# Patient Record
Sex: Male | Born: 1990 | Race: Black or African American | Hispanic: No | Marital: Single | Smoking: Never smoker
Health system: Southern US, Community
[De-identification: ages and names within clinical notes are randomized; demographics above are authoritative.]

## PROBLEM LIST (undated history)

## (undated) DIAGNOSIS — J45909 Unspecified asthma, uncomplicated: Secondary | ICD-10-CM

---
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
## 2008-05-18 ENCOUNTER — Emergency Department (HOSPITAL_COMMUNITY): Admission: EM | Admit: 2008-05-18 | Discharge: 2008-05-18 | Payer: Self-pay | Admitting: Emergency Medicine

## 2008-09-05 ENCOUNTER — Emergency Department (HOSPITAL_COMMUNITY): Admission: EM | Admit: 2008-09-05 | Discharge: 2008-09-05 | Payer: Self-pay | Admitting: Emergency Medicine

## 2008-09-06 ENCOUNTER — Emergency Department (HOSPITAL_COMMUNITY): Admission: EM | Admit: 2008-09-06 | Discharge: 2008-09-07 | Payer: Self-pay | Admitting: Emergency Medicine

## 2010-03-25 ENCOUNTER — Emergency Department (HOSPITAL_COMMUNITY): Admission: EM | Admit: 2010-03-25 | Discharge: 2010-03-26 | Payer: Self-pay | Admitting: Emergency Medicine

## 2010-11-29 ENCOUNTER — Emergency Department (HOSPITAL_COMMUNITY)
Admission: EM | Admit: 2010-11-29 | Discharge: 2010-11-29 | Payer: Self-pay | Source: Home / Self Care | Admitting: Emergency Medicine

## 2010-12-23 ENCOUNTER — Emergency Department (HOSPITAL_COMMUNITY)
Admission: EM | Admit: 2010-12-23 | Discharge: 2010-12-23 | Payer: Self-pay | Source: Home / Self Care | Admitting: Emergency Medicine

## 2011-01-14 ENCOUNTER — Encounter: Payer: Self-pay | Admitting: Pediatrics

## 2011-03-04 ENCOUNTER — Emergency Department (HOSPITAL_COMMUNITY)
Admission: EM | Admit: 2011-03-04 | Discharge: 2011-03-05 | Disposition: A | Discharge status: Home / Self Care | Payer: Self-pay | Attending: Emergency Medicine | Admitting: Emergency Medicine

## 2011-03-04 DIAGNOSIS — B309 Viral conjunctivitis, unspecified: Secondary | ICD-10-CM | POA: Insufficient documentation

## 2011-03-04 DIAGNOSIS — H5789 Other specified disorders of eye and adnexa: Secondary | ICD-10-CM | POA: Insufficient documentation

## 2011-03-04 DIAGNOSIS — H5710 Ocular pain, unspecified eye: Secondary | ICD-10-CM | POA: Insufficient documentation

## 2011-03-06 ENCOUNTER — Emergency Department (HOSPITAL_COMMUNITY): Payer: Self-pay

## 2011-03-06 ENCOUNTER — Emergency Department (HOSPITAL_COMMUNITY)
Admission: EM | Admit: 2011-03-06 | Discharge: 2011-03-06 | Disposition: A | Discharge status: Home / Self Care | Payer: Self-pay | Attending: Emergency Medicine | Admitting: Emergency Medicine

## 2011-03-06 DIAGNOSIS — Z0000 Encounter for general adult medical examination without abnormal findings: Secondary | ICD-10-CM | POA: Insufficient documentation

## 2011-03-06 DIAGNOSIS — H538 Other visual disturbances: Secondary | ICD-10-CM | POA: Insufficient documentation

## 2011-03-06 DIAGNOSIS — H5789 Other specified disorders of eye and adnexa: Secondary | ICD-10-CM | POA: Insufficient documentation

## 2011-03-06 LAB — CBC
MCH: 31.8 pg (ref 26.0–34.0)
MCHC: 34.5 g/dL (ref 30.0–36.0)
MCV: 92.1 fL (ref 78.0–100.0)
Platelets: 257 10*3/uL (ref 150–400)
RBC: 4.66 MIL/uL (ref 4.22–5.81)
RDW: 12.8 % (ref 11.5–15.5)

## 2011-03-06 LAB — DIFFERENTIAL
Basophils Relative: 0 % (ref 0–1)
Eosinophils Absolute: 0.1 10*3/uL (ref 0.0–0.7)
Eosinophils Relative: 1 % (ref 0–5)
Lymphs Abs: 1.4 10*3/uL (ref 0.7–4.0)
Monocytes Relative: 11 % (ref 3–12)
Neutrophils Relative %: 68 % (ref 43–77)

## 2011-03-06 LAB — SEDIMENTATION RATE: Sed Rate: 12 mm/hr (ref 0–16)

## 2011-03-14 LAB — HEPATIC FUNCTION PANEL
AST: 22 U/L (ref 0–37)
Albumin: 4.2 g/dL (ref 3.5–5.2)
Alkaline Phosphatase: 60 U/L (ref 39–117)
Bilirubin, Direct: <0.1 mg/dL (ref 0.0–0.3)
Total Bilirubin: 0.6 mg/dL (ref 0.3–1.2)

## 2011-03-14 LAB — DIFFERENTIAL
Eosinophils Absolute: 0 10*3/uL (ref 0.0–0.7)
Lymphs Abs: 2 10*3/uL (ref 0.7–4.0)
Monocytes Absolute: 0.5 10*3/uL (ref 0.1–1.0)
Monocytes Relative: 8 % (ref 3–12)
Neutrophils Relative %: 58 % (ref 43–77)

## 2011-03-14 LAB — URINALYSIS, ROUTINE W REFLEX MICROSCOPIC
Bilirubin Urine: NEGATIVE
Glucose, UA: NEGATIVE mg/dL
Hgb urine dipstick: NEGATIVE
Ketones, ur: NEGATIVE mg/dL
Protein, ur: 30 mg/dL — AB
Urobilinogen, UA: 1 mg/dL (ref 0.0–1.0)

## 2011-03-14 LAB — BASIC METABOLIC PANEL
CO2: 29 mEq/L (ref 19–32)
Chloride: 105 mEq/L (ref 96–112)
Creatinine, Ser: 0.99 mg/dL (ref 0.4–1.5)
GFR calc Af Amer: >60 mL/min (ref >60)
Potassium: 3.6 mEq/L (ref 3.5–5.1)
Sodium: 139 mEq/L (ref 135–145)

## 2011-03-14 LAB — LIPASE, BLOOD: Lipase: 35 U/L (ref 11–59)

## 2011-03-14 LAB — URINE MICROSCOPIC-ADD ON

## 2011-03-14 LAB — CBC
Hemoglobin: 13.3 g/dL (ref 13.0–17.0)
MCV: 95 fL (ref 78.0–100.0)
RBC: 4.15 MIL/uL — ABNORMAL LOW (ref 4.22–5.81)
WBC: 6 10*3/uL (ref 4.0–10.5)

## 2011-05-11 NOTE — Op Note (Signed)
NAMEPEARL, BERLINGER               ACCOUNT NO.:  [id number]   MEDICAL RECORD NO.:  [id number]          PATIENT TYPE:  OIB   LOCATION:  2899                         FACILITY:  [facility]   PHYSICIAN:  Jon Gills, M.D.  DATE OF BIRTH:  [birth year]   DATE OF PROCEDURE:  07/13/2005  DATE OF DISCHARGE:  07/13/2005                                 OPERATIVE REPORT   PREOPERATIVE DIAGNOSIS:  Periumbilical abdominal pain.   POSTOPERATIVE DIAGNOSIS:  Periumbilical abdominal pain.   PROCEDURE:  Upper GI endoscopy with biopsy.   SURGEON:  Jon Gills, M.D.   ASSISTANT:  None.   FINDINGS:  Following informed written consent, the patient was taken to the  operating room and placed under general anesthesia, with continuous  cardiopulmonary monitoring.  He remained in the supine position, and the  Olympus endoscope was inserted by mouth without difficulty.   Examination of the esophagus, stomach, and duodenum were completely normal,  except for a prominent pancreatic rest on the greater curve of the stomach.  There was no evidence for esophagitis, gastritis, duodenitis, or peptic  ulcer disease.  Several duodenal biopsies were obtained which were  histologically normal.  A solitary gastric biopsy was negative for  Helicobacter, and several other gastric biopsies were submitted in formalin  and found to be histologically unremarkable.  The endoscope was gradually  withdrawn, and the patient was awakened and taken to the recovery room in  satisfactory condition.  Once he is awake, he will be released to the care  of his mother.   DESCRIPTION OF TECHNICAL PROCEDURES USED:  Olympus GIF-Q160 endoscope with  cold biopsy forceps.   DESCRIPTION OF SPECIMENS REMOVED:  Duodenum x3 in formalin, gastric x2 in  formalin, gastric x1 for CLOtesting.       JHC/MEDQ  D:  07/25/2005  T:  07/26/2005  Job:  [id number]   cc:   Maryellen Pile, M.D.  [place], [place]

## 2011-05-11 NOTE — Op Note (Signed)
Benjamin Tran, Benjamin Tran               ACCOUNT NO.:  [id number]   MEDICAL RECORD NO.:  [id number]          PATIENT TYPE:  OIB   LOCATION:  2899                         FACILITY:  [facility]   PHYSICIAN:  Jon Gills, M.D.  DATE OF BIRTH:  [birth year]   DATE OF PROCEDURE:  07/13/2005  DATE OF DISCHARGE:  07/13/2005                                 OPERATIVE REPORT   PREOPERATIVE DIAGNOSIS:  Abdominal pain with abnormal small bowel series.   POSTOPERATIVE DIAGNOSIS:  Abdominal pain with abnormal small bowel series.   OPERATION:  Colonoscopy.   ENDOSCOPIST:  Jon Gills, M.D.   ASSISTANT:  None.   DESCRIPTION OF PROCEDURE:  Following informed consent, the patient was taken  to the operating room and placed under general anesthesia with continuous  cardiopulmonary monitoring.  He remained in the supine position and  following his upper GI endoscopy, I proceeded with performing a complete  colonoscopy.  Examination of the perineum revealed no tags or fissures.  A  digital examination of the rectum revealed an empty rectal vault.  The  Olympus PCF-140 colonoscope was inserted per rectum and advanced without  difficulty to the cecum, for a total of 110 cm in distance.  The overall  prep was poor but no mucosal abnormalities were seen.  There was no evidence  for acute or previous bleeding.  No polyps or vascular abnormalities were  seen.  No biopsies were obtained.  The colonoscope was gradually withdrawn,  and the patient was awakened and taken to the recovery room in satisfactory  condition.   He will be released later today to the care of his mother.   DESCRIPTION OF TECHNICAL PROCEDURE:  Olympus PCF-140 colonoscope.   SPECIMENS:  None removed.       JHC/MEDQ  D:  07/25/2005  T:  07/26/2005  Job:  [id number]   cc:   Maryellen Pile, M.D.  [place], [place]

## 2012-09-05 ENCOUNTER — Encounter (HOSPITAL_COMMUNITY): Payer: Self-pay | Admitting: Emergency Medicine

## 2012-09-05 ENCOUNTER — Emergency Department (INDEPENDENT_AMBULATORY_CARE_PROVIDER_SITE_OTHER)
Admission: EM | Admit: 2012-09-05 | Discharge: 2012-09-05 | Disposition: A | Discharge status: Home / Self Care | Payer: Self-pay | Source: Home / Self Care | Attending: Emergency Medicine | Admitting: Emergency Medicine

## 2012-09-05 DIAGNOSIS — J029 Acute pharyngitis, unspecified: Secondary | ICD-10-CM

## 2012-09-05 NOTE — ED Provider Notes (Signed)
Medical screening examination/treatment/procedure(s) were performed by non-physician practitioner and as supervising physician I was immediately available for consultation/collaboration.  Leslee Home, M.D.   Reuben Likes, MD 09/05/12 [phone]

## 2012-09-05 NOTE — ED Provider Notes (Signed)
History     CSN: [id number]  Arrival date & time 09/05/12  1542   None     Chief Complaint  Patient presents with  . Sore Throat    (Consider location/radiation/quality/duration/timing/severity/associated sxs/prior treatment) Patient is a 21 y.o. male presenting with pharyngitis. The history is provided by the patient.  Sore Throat  Benjamin Tran is a 21 y.o. male who complains of onset of sore throat since last night and neck pain, no known fever, has not taken medications for symptoms.   + sore throat No cough, non productive No pleuritic pain No wheezing No nasal congestion No post-nasal drainage No sinus pain/pressure No voice changes No chest congestion No itchy/red eyes No earache No hemoptysis + SOB No chills/sweats No fever No nausea No vomiting No abdominal pain No diarrhea No skin rashes No fatigue No myalgias No headache  No ill contacts   History reviewed. No pertinent past medical history.  History reviewed. No pertinent past surgical history.  No family history on file.  History  Substance Use Topics  . Smoking status: Current Every Day Smoker  . Smokeless tobacco: Not on file  . Alcohol Use: No      Review of Systems  All other systems reviewed and are negative.    Allergies  Review of patient's allergies indicates no known allergies.  Home Medications  No current outpatient prescriptions on file.  BP 108/67  Pulse 74  Temp 98.3 F (36.8 C) (Oral)  Resp 14  SpO2 98%  Physical Exam  Nursing note and vitals reviewed. Constitutional: He is oriented to person, place, and time. Vital signs are normal. He appears well-developed and well-nourished. He is active and cooperative.  HENT:  Head: Normocephalic.  Right Ear: Hearing, external ear and ear canal normal. Tympanic membrane is bulging.  Left Ear: Hearing, tympanic membrane, external ear and ear canal normal.  Nose: Rhinorrhea present. Right sinus exhibits no maxillary sinus  tenderness and no frontal sinus tenderness. Left sinus exhibits no maxillary sinus tenderness and no frontal sinus tenderness.  Mouth/Throat: Uvula is midline, oropharynx is clear and moist and mucous membranes are normal. No posterior oropharyngeal erythema.       Post nasal drip noted posterior pharynx  Eyes: Conjunctivae normal are normal. Pupils are equal, round, and reactive to light. No scleral icterus.  Neck: Trachea normal. Neck supple.  Cardiovascular: Normal rate, regular rhythm, normal heart sounds and normal pulses.   Pulmonary/Chest: Effort normal and breath sounds normal.  Lymphadenopathy:       Head (right side): Tonsillar adenopathy present. No submental, no submandibular, no preauricular, no posterior auricular and no occipital adenopathy present.       Head (left side): Tonsillar adenopathy present. No submental, no submandibular, no preauricular, no posterior auricular and no occipital adenopathy present.    He has no cervical adenopathy.  Neurological: He is alert and oriented to person, place, and time. No cranial nerve deficit or sensory deficit.  Skin: Skin is warm and dry. No rash noted.  Psychiatric: He has a normal mood and affect. His speech is normal and behavior is normal. Judgment and thought content normal. Cognition and memory are normal.    ED Course  Procedures (including critical care time)   Labs Reviewed  POCT RAPID STREP A ([facility] ONLY)   No results found.   1. Pharyngitis       MDM  Rapid strep negative.  Take medications as prescribed, increase fluid intake, tylenol for fever/discomfort.  Johnsie Kindred, NP 09/05/12 1617

## 2012-09-05 NOTE — ED Notes (Signed)
Reports sore throat and fullness on right side of neck.denies cough and no runny nose

## 2014-10-02 ENCOUNTER — Emergency Department (HOSPITAL_COMMUNITY): Payer: BC Managed Care – PPO

## 2014-10-02 ENCOUNTER — Encounter (HOSPITAL_COMMUNITY): Payer: Self-pay | Admitting: Emergency Medicine

## 2014-10-02 ENCOUNTER — Emergency Department (HOSPITAL_COMMUNITY)
Admission: EM | Admit: 2014-10-02 | Discharge: 2014-10-02 | Disposition: A | Discharge status: Home / Self Care | Payer: BC Managed Care – PPO | Attending: Emergency Medicine | Admitting: Emergency Medicine

## 2014-10-02 DIAGNOSIS — M25551 Pain in right hip: Secondary | ICD-10-CM | POA: Diagnosis present

## 2014-10-02 DIAGNOSIS — Z720 Tobacco use: Secondary | ICD-10-CM | POA: Insufficient documentation

## 2014-10-02 DIAGNOSIS — M5417 Radiculopathy, lumbosacral region: Secondary | ICD-10-CM | POA: Diagnosis not present

## 2014-10-02 MED ORDER — PREDNISONE 20 MG PO TABS: 40.0000 mg | ORAL_TABLET | Freq: Every day | ORAL | Status: DC

## 2014-10-02 MED ORDER — IBUPROFEN 400 MG PO TABS
600.0000 mg | ORAL_TABLET | Freq: Once | ORAL | Status: AC
Start: 2014-10-02 — End: 2014-10-02
  Administered 2014-10-02: 600 mg via ORAL
  Filled 2014-10-02 (×2): qty 1

## 2014-10-02 MED ORDER — HYDROCODONE-ACETAMINOPHEN 5-325 MG PO TABS: 1.0000 | ORAL_TABLET | Freq: Four times a day (QID) | ORAL | Status: DC | PRN

## 2014-10-02 MED ORDER — CYCLOBENZAPRINE HCL 10 MG PO TABS: 10.0000 mg | ORAL_TABLET | Freq: Two times a day (BID) | ORAL | Status: DC | PRN

## 2014-10-02 MED ORDER — HYDROCODONE-ACETAMINOPHEN 5-325 MG PO TABS
1.0000 | ORAL_TABLET | Freq: Once | ORAL | Status: AC
  Administered 2014-10-02: 1 via ORAL
  Filled 2014-10-02: qty 1

## 2014-10-02 NOTE — ED Provider Notes (Signed)
CSN: [id number]     Arrival date & time 10/02/14  1856 History  This chart was scribed for Jaynie Crumbleatyana Lorma Heater, PA-C working with Vida RollerBrian D Miller, MD by Evon Slackerrance Branch, ED Scribe. This patient was seen in room TR06C/TR06C and the patient's care was started at 7:38 PM.      Chief Complaint  Patient presents with  . Hip Pain    Patient is a 23 y.o. male presenting with hip pain. The history is provided by the patient. No language interpreter was used.  Hip Pain   HPI Comments: Benjamin Tran is a 23 y.o. male who presents to the Emergency Department complaining of right sided hip pain onset this morning. He states he has associated gait problem. He states that he is having a slight pain on the right side of his lower back. He states that he woke up this morning when he noticed this pain. He states that the pain has been progressively worsening. He states that sometimes when he walks he notices that his right foot sometimes goes numb. Denies any trauma or injury to the hip. No fever chills. No iv drug use. No medications tried.   History reviewed. No pertinent past medical history. History reviewed. No pertinent past surgical history. No family history on file. History  Substance Use Topics  . Smoking status: Current Every Day Smoker  . Smokeless tobacco: Not on file  . Alcohol Use: No    Review of Systems  Musculoskeletal: Positive for arthralgias (right hip pain), back pain and gait problem.  All other systems reviewed and are negative.   Allergies  Review of patient's allergies indicates no known allergies.  Home Medications   Prior to Admission medications   Not on File   Triage Vitals: BP 114/64  Pulse 78  Temp(Src) 98.1 F (36.7 C) (Oral)  Resp 20  SpO2 99%  Physical Exam  Nursing note and vitals reviewed. Constitutional: He is oriented to person, place, and time. He appears well-developed and well-nourished. No distress.  HENT:  Head: Normocephalic and  atraumatic.  Eyes: Conjunctivae and EOM are normal.  Neck: Neck supple. No tracheal deviation present.  Cardiovascular: Normal rate.   Pulmonary/Chest: Effort normal. No respiratory distress.  Musculoskeletal: Normal range of motion.  No midline lumbar spine tenderness. Tender in the right SI joint. Tenderness extends into the right buttock, right hip. Pain with straight leg raise. No pain with hip flexion with flexed knee. No pain with external rotation, pain with internal rotation of the hip.  Neurological: He is alert and oriented to person, place, and time.  5/5 and equal lower extremity strength. 2+ and equal patellar reflexes bilaterally. Pt able to dorsiflex bilateral toes and feet with good strength against resistance. Equal sensation bilaterally over thighs and lower legs.   Skin: Skin is warm and dry.  Psychiatric: He has a normal mood and affect. His behavior is normal.    ED Course  Procedures (including critical care time) DIAGNOSTIC STUDIES: Oxygen Saturation is 99% on RA, normal by my interpretation.    COORDINATION OF CARE: 7:57 PM-Discussed treatment plan which includes right hip and lumbar spine x-ray with pt at bedside and pt agreed to plan.     Labs Review Labs Reviewed - No data to display  Imaging Review Dg Lumbar Spine Complete  10/02/2014   CLINICAL DATA:  Right hip pain and back pain since this a.m.  EXAM: LUMBAR SPINE - COMPLETE 4+ VIEW  COMPARISON:  CT 03/25/2010.  FINDINGS:  No acute bony abnormality. Normal bony alignment mineralization. Paraspinal soft tissues are unremarkable. Calcifications within the pelvis consistent with phleboliths.  IMPRESSION: No acute abnormality.   Electronically Signed   By: Maisie Fushomas  Register   On: 10/02/2014 20:18   Dg Hip Complete Right  10/02/2014   CLINICAL DATA:  Right hip pain since this a.m.  No trauma .  EXAM: RIGHT HIP - COMPLETE 2+ VIEW  COMPARISON:  None.  FINDINGS: There is no evidence of hip fracture or  dislocation. There is no evidence of arthropathy or other focal bone abnormality.  IMPRESSION: Negative.   Electronically Signed   By: Maisie Fushomas  Register   On: 10/02/2014 20:17     EKG Interpretation None      MDM   Final diagnoses:  None    Patient with right SI joint, right buttock, right hip pain. The symptoms and exam is consistent with radiculopathy, most likely from a herniated disc versus sciatica. Patient is neurovascularly intact. A retroflexed to suggest cauda equina at this time. He is afebrile. X-rays of the hip and lumbar spine obtained given no prior imaging and are negative. Will treat with prednisone, Flexeril, Norco, followup with primary care Dr.   Ceasar MonsFiled Vitals:   10/02/14 1901  BP: 114/64  Pulse: 78  Temp: 98.1 F (36.7 C)  TempSrc: Oral  Resp: 20  SpO2: 99%   I personally performed the services described in this documentation, which was scribed in my presence. The recorded information has been reviewed and is accurate.      Lottie Musselatyana A Jusitn Salsgiver, PA-C 10/02/14 2059

## 2014-10-02 NOTE — ED Notes (Signed)
Tatyana, PA-C, is at the bedisde.

## 2014-10-02 NOTE — ED Notes (Signed)
Pt. woke up this morning with right hip pain radiating to right thigh , denies injury /ambulatory.

## 2014-10-02 NOTE — ED Notes (Signed)
Patient is now back from x-ray

## 2014-10-02 NOTE — Discharge Instructions (Signed)
Prednisone until all gone for inflammation. norco for severe pain. Flexeril for spasms. Follow up with primary care provider if not improving.   Lumbosacral Radiculopathy Lumbosacral radiculopathy is a pinched nerve or nerves in the low back (lumbosacral area). When this happens you may have weakness in your legs and may not be able to stand on your toes. You may have pain going down into your legs. There may be difficulties with walking normally. There are many causes of this problem. Sometimes this may happen from an injury, or simply from arthritis or boney problems. It may also be caused by other illnesses such as diabetes. If there is no improvement after treatment, further studies may be done to find the exact cause. DIAGNOSIS  X-rays may be needed if the problems become long standing. Electromyograms may be done. This study is one in which the working of nerves and muscles is studied. HOME CARE INSTRUCTIONS   Applications of ice packs may be helpful. Ice can be used in a plastic bag with a towel around it to prevent frostbite to skin. This may be used every 2 hours for 20 to 30 minutes, or as needed, while awake, or as directed by your caregiver.  Only take over-the-counter or prescription medicines for pain, discomfort, or fever as directed by your caregiver.  If physical therapy was prescribed, follow your caregiver's directions. SEEK IMMEDIATE MEDICAL CARE IF:   You have pain not controlled with medications.  You seem to be getting worse rather than better.  You develop increasing weakness in your legs.  You develop loss of bowel or bladder control.  You have difficulty with walking or balance, or develop clumsiness in the use of your legs.  You have a fever. MAKE SURE YOU:   Understand these instructions.  Will watch your condition.  Will get help right away if you are not doing well or get worse. Document Released: 12/10/2005 Document Revised: 03/03/2012 Document  Reviewed: 07/30/2008 [facility] Patient Information 2015 [place], [place]. This information is not intended to replace advice given to you by your health care provider. Make sure you discuss any questions you have with your health care provider.    Emergency Department Resource Guide 1) Find a Doctor and Pay Out of Pocket Although you won't have to find out who is covered by your insurance plan, it is a good idea to ask around and get recommendations. You will then need to call the office and see if the doctor you have chosen will accept you as a new patient and what types of options they offer for patients who are self-pay. Some doctors offer discounts or will set up payment plans for their patients who do not have insurance, but you will need to ask so you aren't surprised when you get to your appointment.  2) Contact Your Local Health Department Not all health departments have doctors that can see patients for sick visits, but many do, so it is worth a call to see if yours does. If you don't know where your local health department is, you can check in your phone book. The CDC also has a tool to help you locate your state's health department, and many state websites also have listings of all of their local health departments.  3) Find a Walk-in Clinic If your illness is not likely to be very severe or complicated, you may want to try a walk in clinic. These are popping up all over the country in pharmacies, drugstores, and shopping  centers. They're usually staffed by nurse practitioners or physician assistants that have been trained to treat common illnesses and complaints. They're usually fairly quick and inexpensive. However, if you have serious medical issues or chronic medical problems, these are probably not your best option.  No Primary Care Doctor: - Call Health Connect at  [phone] - they can help you locate a primary care doctor that  accepts your insurance, provides certain services,  etc. - Physician Referral Service- [phone]  Chronic Pain Problems: Organization         Address  Phone   Notes  [facility]  [phone] Patients need to be referred by their primary care doctor.   Medication Assistance: Organization         Address  Phone   Notes  [facility]ication [facility] [address] [place], [place] [address] [phone] --Must be a resident of [facility] [place] -- Must have NO insurance coverage whatsoever (no Medicaid/ Medicare, etc.) -- The pt. MUST have a primary care doctor that directs their care regularly and follows them in the community   MedAssist  [phone]   [employer]  [phone]    Agencies that provide inexpensive medical care: Organization         Address  Phone   Notes  [facility]  [phone]   [facility]    [phone]   [facility] [address] [place], [place] [address] [phone]   [facility] [place] [address], [place] [phone]) [phone]   Planned Parenthood    [phone](336) [phone]   [facility]    [phone](336) [phone]   Community Health and [facility]  [address], [place] Phone:  [phone], Fax:  [phone]) [phone] Hours of Operation:  9 am - 6 pm, M-F.  Also accepts Medicaid/Medicare and self-pay.  [facility]  [address], [place] Phone: [phone], Fax: [phone]. Hours of Operation:  8:30 am - 5:30 pm, M-F.  Also accepts Medicaid and self-pay.  [facility] [place] [address], [place] Phone: [phone]   [facility] [address], [place], [place] [phone], Ext. [phone] Mondays & Thursdays: 7-9 AM.  First 15 patients are seen on a first come, first serve basis.    Medicaid-accepting [facility] Providers:  Organization         Address  Phone   Notes  [facility] [address], [place] [phone] Also accepts self-pay patients.  [facility] [address], [place]  [phone]   [facility] [address], [place] [phone]   [facility] [address] [place] [address], [place] [phone]) [phone]   Renaye RakersVeita Bland [address], [place]   [phone] Only accepts [place] Access [place] patients after they have their name applied to their card.   Self-Pay (no insurance) in [facility] [place]:  Organization         Address  Phone   Notes  Sickle Cell Patients, [facility] [address], [place] [phone](336) [phone]   [facility] [address], [place] [phone]) [phone]   [facility] [place]  [address], [place] [phone]  [facility]/Dr. Osei-Bonsu  [address] [place] [address], [place] or [address], [place] [phone] Phone number for both [place] and [place] locations is the same.  [facility] [address], [place] [phone]   [facility] [address] [place] [address], [place] or [address] [phone]   [facility] [address], [place] [phone], phone; [phone], fax Sees patients 1st and 3rd Saturday of every month.  Must not qualify for public or private insurance (i.e. Medicaid, Medicare, [place] Health Choice, Veterans' Benefits)  Household income should be no more than 200% of the poverty level The clinic cannot treat you if you are pregnant or think you are pregnant  Sexually transmitted diseases are not treated at the clinic.    Dental Care: Organization         Address  Phone  Notes  [facility] of [facility] [address], [place] [phone] Accepts children up to  age 25 who are enrolled in [place]icaid or [place] Health Choice; pregnant women with a Medicaid card; and children who have applied for Medicaid or [place] Health Choice, but were declined, whose parents can pay a reduced fee at time of service.  [facility] of [facility] [place]  [address], [place] [phone] Accepts children up to age 25 who are enrolled in [place]icaid or [place] Health Choice; pregnant women with a Medicaid card; and children who have applied for Medicaid or [place] Health Choice, but were declined, whose parents can pay a reduced fee at time of service.  [facility]  [address], [place] [phone] Patients are seen by appointment only. Walk-ins are not accepted. [facility] will see patients 25 years of age and older. Monday - Tuesday (8am-5pm) Most Wednesdays (8:30-5pm) $30 per visit, cash only  [facility]  [address], [facility] [phone] Patients are seen by appointment only. Walk-ins are not accepted. [facility] will see patients 25 years of age and older. One Wednesday Evening (Monthly: Volunteer Based).  $30 per visit, cash only  [employer] [facility]  [phone]) [phone] for adults; Children under age 494, call Graduate Pediatric Dentistry at [phone]. Children aged 344-14, please call [phone]) [phone] to request a pediatric application.  Dental services are provided in all areas of dental care including fillings, crowns and bridges, complete and partial dentures, implants, gum treatment, root canals, and extractions. Preventive care is also provided. Treatment is provided to both adults and children. Patients are selected via a lottery and there is often a waiting list.   [facility] [address], [place]  [phone]) [phone] [email or web address]   [facility] [address], [place], [place] [phone], Ext. [phone] Second and Fourth Thursday of  each month, opens at 6:30 AM; Clinic ends at 9 AM.  Patients are seen on a first-come first-served basis, and a limited number are seen during each clinic.   [facility]  [address], [place], [place] [phone]   Eligibility Requirements You must have lived in [place], [place], or [place] counties for at least the last three months.   You cannot be eligible for state or federal sponsored National Cityhealthcare insurance, including CIGNAVeterans Administration, [place]icaid, or Har[employer] EntertainmentMedicare.   You generally cannot be eligible for healthcare insurance  through your employer.    How to apply: Eligibility screenings are held every Tuesday and Wednesday afternoon from 1:00 pm until 4:00 pm. You do not need an appointment for the interview!  [facility] [address], [place], [place] [phone]   [facility]  [phone]   [facility]  [phone]   [facility]  [phone]    Behavioral Health Resources in the Community: Intensive Outpatient Programs Organization         Address  Phone  Notes  [facility] [address], [place], [place] [phone]   [facility] [address], [place], [place] [phone]   [facility] [address], [place], [place]  [phone]   [facility] [address],  [place], [place] [phone] or [phone]   Substance Abuse Resources Organization         Address  Phone  Notes  [facility] Services  [phone]   [facility]  [phone]   [facility]  [phone]   [facility]  [phone]   Residential & Outpatient Substance Abuse Program  [phone]   Psychological Services Organization         Address  Phone  Notes  [facility]  [phone]- [phone]   [facility]  [phone]- [phone]   [facility] [address],  [place] [phone] or [phone]    Mobile Crisis Teams Organization         Address  Phone  Notes  [facility], Mobile Crisis Care Unit  [phone]   [facility]  [address] [place], [place] [phone]   Doristine LocksSharon DeEsch [address] [place] [phone]    Self-Help/Support Groups Organization         Address  Phone             Notes  [facility]ental Health Assoc. of [place] - variety of support groups  336- [phone] Call for more information  Narcotics Anonymous (NA), [facility]aring [facility] [address], [place]  2 meetings at this location   Statisticianesidential Treatment Programs Organization         Address  Phone  Notes  [facility] [address],    [place]  [phone]   [facility]  [address], [place] [address], [place], [place] [phone]   [facility] [address], [place] [phone] Admissions: 8am-3pm M-F  [facility] [address]    [place], [place] [phone]   [facility] [address], [place], [place] [phone]   [facility] [address]  [place], [place] [phone]   [facility] - Intensive Outpatient [address], [place], [place] [phone]   [facility] (Addiction Recovery [facility] [address]  [place], [place] [phone] or [phone]   [facility] (RTS) [address] [place], [place] [phone] Accepts Medicaid  [facility] [address]  [place] [phone] Substance Abuse/Addiction Treatment   [facility] Behavioral Health Resources Organization         Address  Phone  Notes  [facility]  [phone]   Angie FavaJulie Brannon, PhD [address] [place], [place]   [phone]) [phone] or [phone]   [facility]   [address] [place], [place] [phone]) [phone]  [facility] [address], [place], [place] [phone] Insurance/Medicaid/sponsorship through [facility]  °[facility] [address]                                    [place], [place] [phone] Therapy/tele-psych/case  °[facility] [address]  ° [place], [place] [phone]    °Dr. Arfeen  [phone]   °[facility] [place]  [employer] [place] [facility] 1) [address], [place] °2) [address], [place] °3)  [address], [place] ([phone]) [phone] °(336) [phone] ° °([phone]) [phone]   °[place] Child Abuse Hotline (336) 342-1394 or (336) [phone] (After Hours)    ° ° ° °

## 2014-10-03 NOTE — ED Provider Notes (Signed)
Medical screening examination/treatment/procedure(s) were performed by non-physician practitioner and as supervising physician I was immediately available for consultation/collaboration.    Vida RollerBrian D Kashaun Bebo, MD 10/03/14 [phone]

## 2015-06-18 ENCOUNTER — Encounter (HOSPITAL_COMMUNITY): Payer: Self-pay | Admitting: Emergency Medicine

## 2015-06-18 ENCOUNTER — Emergency Department (HOSPITAL_COMMUNITY)
Admission: EM | Admit: 2015-06-18 | Discharge: 2015-06-18 | Disposition: A | Discharge status: Home / Self Care | Payer: Self-pay | Attending: Emergency Medicine | Admitting: Emergency Medicine

## 2015-06-18 DIAGNOSIS — Y998 Other external cause status: Secondary | ICD-10-CM | POA: Insufficient documentation

## 2015-06-18 DIAGNOSIS — Z880 Allergy status to penicillin: Secondary | ICD-10-CM | POA: Insufficient documentation

## 2015-06-18 DIAGNOSIS — S81812A Laceration without foreign body, left lower leg, initial encounter: Secondary | ICD-10-CM | POA: Insufficient documentation

## 2015-06-18 DIAGNOSIS — Y9289 Other specified places as the place of occurrence of the external cause: Secondary | ICD-10-CM | POA: Insufficient documentation

## 2015-06-18 DIAGNOSIS — Z7952 Long term (current) use of systemic steroids: Secondary | ICD-10-CM | POA: Insufficient documentation

## 2015-06-18 DIAGNOSIS — S80812A Abrasion, left lower leg, initial encounter: Secondary | ICD-10-CM | POA: Insufficient documentation

## 2015-06-18 DIAGNOSIS — Y9302 Activity, running: Secondary | ICD-10-CM | POA: Insufficient documentation

## 2015-06-18 DIAGNOSIS — Z720 Tobacco use: Secondary | ICD-10-CM | POA: Insufficient documentation

## 2015-06-18 DIAGNOSIS — Z23 Encounter for immunization: Secondary | ICD-10-CM | POA: Insufficient documentation

## 2015-06-18 DIAGNOSIS — W1789XA Other fall from one level to another, initial encounter: Secondary | ICD-10-CM | POA: Insufficient documentation

## 2015-06-18 MED ORDER — TETANUS-DIPHTH-ACELL PERTUSSIS 5-2.5-18.5 LF-MCG/0.5 IM SUSP
0.5000 mL | Freq: Once | INTRAMUSCULAR | Status: AC
  Administered 2015-06-18: 0.5 mL via INTRAMUSCULAR
  Filled 2015-06-18: qty 0.5

## 2015-06-18 MED ORDER — LIDOCAINE-EPINEPHRINE (PF) 2 %-1:200000 IJ SOLN
20.0000 mL | Freq: Once | INTRAMUSCULAR | Status: AC
  Administered 2015-06-18: 20 mL
  Filled 2015-06-18: qty 20
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
## 2015-06-18 NOTE — Discharge Instructions (Signed)
Suture removal in 10 days  Laceration Care, Adult A laceration is a cut or lesion that goes through all layers of the skin and into the tissue just beneath the skin. TREATMENT  Some lacerations may not require closure. Some lacerations may not be able to be closed due to an increased risk of infection. It is important to see your caregiver as soon as possible after an injury to minimize the risk of infection and maximize the opportunity for successful closure. If closure is appropriate, pain medicines may be given, if needed. The wound will be cleaned to help prevent infection. Your caregiver will use stitches (sutures), staples, wound glue (adhesive), or skin adhesive strips to repair the laceration. These tools bring the skin edges together to allow for faster healing and a better cosmetic outcome. However, all wounds will heal with a scar. Once the wound has healed, scarring can be minimized by covering the wound with sunscreen during the day for 1 full year. HOME CARE INSTRUCTIONS  For sutures or staples:  Keep the wound clean and dry.  If you were given a bandage (dressing), you should change it at least once a day. Also, change the dressing if it becomes wet or dirty, or as directed by your caregiver.  Wash the wound with soap and water 2 times a day. Rinse the wound off with water to remove all soap. Pat the wound dry with a clean towel.  After cleaning, apply a thin layer of the antibiotic ointment as recommended by your caregiver. This will help prevent infection and keep the dressing from sticking.  You may shower as usual after the first 24 hours. Do not soak the wound in water until the sutures are removed.  Only take over-the-counter or prescription medicines for pain, discomfort, or fever as directed by your caregiver.  Get your sutures or staples removed as directed by your caregiver. For skin adhesive strips:  Keep the wound clean and dry.  Do not get the skin adhesive  strips wet. You may bathe carefully, using caution to keep the wound dry.  If the wound gets wet, pat it dry with a clean towel.  Skin adhesive strips will fall off on their own. You may trim the strips as the wound heals. Do not remove skin adhesive strips that are still stuck to the wound. They will fall off in time. For wound adhesive:  You may briefly wet your wound in the shower or bath. Do not soak or scrub the wound. Do not swim. Avoid periods of heavy perspiration until the skin adhesive has fallen off on its own. After showering or bathing, gently pat the wound dry with a clean towel.  Do not apply liquid medicine, cream medicine, or ointment medicine to your wound while the skin adhesive is in place. This may loosen the film before your wound is healed.  If a dressing is placed over the wound, be careful not to apply tape directly over the skin adhesive. This may cause the adhesive to be pulled off before the wound is healed.  Avoid prolonged exposure to sunlight or tanning lamps while the skin adhesive is in place. Exposure to ultraviolet light in the first year will darken the scar.  The skin adhesive will usually remain in place for 5 to 10 days, then naturally fall off the skin. Do not pick at the adhesive film. You may need a tetanus shot if:  You cannot remember when you had your last tetanus shot.  You have never had a tetanus shot. If you get a tetanus shot, your arm may swell, get red, and feel warm to the touch. This is common and not a problem. If you need a tetanus shot and you choose not to have one, there is a rare chance of getting tetanus. Sickness from tetanus can be serious. SEEK MEDICAL CARE IF:   You have redness, swelling, or increasing pain in the wound.  You see a red line that goes away from the wound.  You have yellowish-white fluid (pus) coming from the wound.  You have a fever.  You notice a bad smell coming from the wound or dressing.  Your  wound breaks open before or after sutures have been removed.  You notice something coming out of the wound such as wood or glass.  Your wound is on your hand or foot and you cannot move a finger or toe. SEEK IMMEDIATE MEDICAL CARE IF:   Your pain is not controlled with prescribed medicine.  You have severe swelling around the wound causing pain and numbness or a change in color in your arm, hand, leg, or foot.  Your wound splits open and starts bleeding.  You have worsening numbness, weakness, or loss of function of any joint around or beyond the wound.  You develop painful lumps near the wound or on the skin anywhere on your body. MAKE SURE YOU:   Understand these instructions.  Will watch your condition.  Will get help right away if you are not doing well or get worse. Document Released: 12/10/2005 Document Revised: 03/03/2012 Document Reviewed: 06/05/2011 [facility] Patient Information 2015 [place], [place]. This information is not intended to replace advice given to you by your health care provider. Make sure you discuss any questions you have with your health care provider.
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
## 2016-02-06 ENCOUNTER — Encounter (HOSPITAL_COMMUNITY): Payer: Self-pay | Admitting: *Deleted

## 2016-02-06 ENCOUNTER — Emergency Department (HOSPITAL_COMMUNITY): Payer: Self-pay

## 2016-02-06 ENCOUNTER — Emergency Department (HOSPITAL_COMMUNITY)
Admission: EM | Admit: 2016-02-06 | Discharge: 2016-02-06 | Disposition: A | Discharge status: Home / Self Care | Payer: Self-pay | Attending: Emergency Medicine | Admitting: Emergency Medicine

## 2016-02-06 DIAGNOSIS — Y998 Other external cause status: Secondary | ICD-10-CM | POA: Insufficient documentation

## 2016-02-06 DIAGNOSIS — Z880 Allergy status to penicillin: Secondary | ICD-10-CM | POA: Insufficient documentation

## 2016-02-06 DIAGNOSIS — Y92410 Unspecified street and highway as the place of occurrence of the external cause: Secondary | ICD-10-CM | POA: Insufficient documentation

## 2016-02-06 DIAGNOSIS — S52591A Other fractures of lower end of right radius, initial encounter for closed fracture: Secondary | ICD-10-CM | POA: Insufficient documentation

## 2016-02-06 DIAGNOSIS — Z7951 Long term (current) use of inhaled steroids: Secondary | ICD-10-CM | POA: Insufficient documentation

## 2016-02-06 DIAGNOSIS — Y9389 Activity, other specified: Secondary | ICD-10-CM | POA: Insufficient documentation

## 2016-02-06 DIAGNOSIS — S62101A Fracture of unspecified carpal bone, right wrist, initial encounter for closed fracture: Secondary | ICD-10-CM

## 2016-02-06 MED ORDER — IBUPROFEN 400 MG PO TABS
600.0000 mg | ORAL_TABLET | Freq: Once | ORAL | Status: AC
  Administered 2016-02-06: 600 mg via ORAL
  Filled 2016-02-06: qty 1

## 2016-02-06 MED ORDER — IBUPROFEN 600 MG PO TABS: 600.0000 mg | ORAL_TABLET | Freq: Four times a day (QID) | ORAL | Status: DC | PRN

## 2016-02-06 MED ORDER — HYDROCODONE-ACETAMINOPHEN 5-325 MG PO TABS: 1.0000 | ORAL_TABLET | ORAL | Status: DC | PRN
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
## 2016-02-06 NOTE — Progress Notes (Signed)
Orthopedic Tech Progress Note Patient Details:  Benjamin Tran 1991/04/18 [id number]  Ortho Devices Type of Ortho Device: Ace wrap, Arm sling, Sugartong splint Ortho Device/Splint Location: rue Ortho Device/Splint Interventions: Application   Coletta Lockner 02/06/2016, 9:48 AM

## 2016-02-06 NOTE — ED Notes (Signed)
Declined W/C at D/C and was escorted to lobby by RN. 

## 2016-02-06 NOTE — Discharge Instructions (Signed)
You were evaluated in the ED today for your wrist pain after fall. You were found to have a fractured/broken wrist. You'll need to follow-up with Dr. Ronie Spies office, call him this afternoon to schedule an appointment for reevaluation. Take your pain medicines as prescribed as we discussed. Return to ED for new or worsening symptoms.  Cast or Splint Care Casts and splints support injured limbs and keep bones from moving while they heal.  HOME CARE  Keep the cast or splint uncovered during the drying period.  A plaster cast can take 24 to 48 hours to dry.  A fiberglass cast will dry in less than 1 hour.  Do not rest the cast on anything harder than a pillow for 24 hours.  Do not put weight on your injured limb. Do not put pressure on the cast. Wait for your doctor's approval.  Keep the cast or splint dry.  Cover the cast or splint with a plastic bag during baths or wet weather.  If you have a cast over your chest and belly (trunk), take sponge baths until the cast is taken off.  If your cast gets wet, dry it with a towel or blow dryer. Use the cool setting on the blow dryer.  Keep your cast or splint clean. Wash a dirty cast with a damp cloth.  Do not put any objects under your cast or splint.  Do not scratch the skin under the cast with an object. If itching is a problem, use a blow dryer on a cool setting over the itchy area.  Do not trim or cut your cast.  Do not take out the padding from inside your cast.  Exercise your joints near the cast as told by your doctor.  Raise (elevate) your injured limb on 1 or 2 pillows for the first 1 to 3 days. GET HELP IF:  Your cast or splint cracks.  Your cast or splint is too tight or too loose.  You itch badly under the cast.  Your cast gets wet or has a soft spot.  You have a bad smell coming from the cast.  You get an object stuck under the cast.  Your skin around the cast becomes red or sore.  You have new or more pain  after the cast is put on. GET HELP RIGHT AWAY IF:  You have fluid leaking through the cast.  You cannot move your fingers or toes.  Your fingers or toes turn blue or white or are cool, painful, or puffy (swollen).  You have tingling or lose feeling (numbness) around the injured area.  You have bad pain or pressure under the cast.  You have trouble breathing or have shortness of breath.  You have chest pain.   This information is not intended to replace advice given to you by your health care provider. Make sure you discuss any questions you have with your health care provider.   Document Released: 04/11/2011 Document Revised: 08/12/2013 Document Reviewed: 06/18/2013 Elsevier Interactive Patient Education [employer]

## 2016-02-06 NOTE — ED Notes (Signed)
Placed call to Dr. Dwana Curd Dr.

## 2016-02-06 NOTE — ED Notes (Signed)
[employer] at bed side.
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
## 2017-01-26 ENCOUNTER — Emergency Department (HOSPITAL_COMMUNITY): Payer: No Typology Code available for payment source

## 2017-01-26 ENCOUNTER — Emergency Department (HOSPITAL_COMMUNITY)
Admission: EM | Admit: 2017-01-26 | Discharge: 2017-01-27 | Disposition: A | Discharge status: Home / Self Care | Payer: No Typology Code available for payment source | Attending: Emergency Medicine | Admitting: Emergency Medicine

## 2017-01-26 ENCOUNTER — Encounter (HOSPITAL_COMMUNITY): Payer: Self-pay | Admitting: Emergency Medicine

## 2017-01-26 DIAGNOSIS — Y939 Activity, unspecified: Secondary | ICD-10-CM | POA: Diagnosis not present

## 2017-01-26 DIAGNOSIS — Z79899 Other long term (current) drug therapy: Secondary | ICD-10-CM | POA: Insufficient documentation

## 2017-01-26 DIAGNOSIS — Y999 Unspecified external cause status: Secondary | ICD-10-CM | POA: Insufficient documentation

## 2017-01-26 DIAGNOSIS — Y92410 Unspecified street and highway as the place of occurrence of the external cause: Secondary | ICD-10-CM | POA: Diagnosis not present

## 2017-01-26 DIAGNOSIS — S80812A Abrasion, left lower leg, initial encounter: Secondary | ICD-10-CM | POA: Insufficient documentation

## 2017-01-26 DIAGNOSIS — S6992XA Unspecified injury of left wrist, hand and finger(s), initial encounter: Secondary | ICD-10-CM | POA: Diagnosis present

## 2017-01-26 DIAGNOSIS — S62012A Displaced fracture of distal pole of navicular [scaphoid] bone of left wrist, initial encounter for closed fracture: Secondary | ICD-10-CM | POA: Insufficient documentation

## 2017-01-26 DIAGNOSIS — T07XXXA Unspecified multiple injuries, initial encounter: Secondary | ICD-10-CM

## 2017-01-26 MED ORDER — HYDROCODONE-ACETAMINOPHEN 5-325 MG PO TABS
1.0000 | ORAL_TABLET | Freq: Once | ORAL | Status: AC
  Administered 2017-01-26: 1 via ORAL
  Filled 2017-01-26: qty 1

## 2017-01-26 MED ORDER — CYCLOBENZAPRINE HCL 10 MG PO TABS: 10.0000 mg | ORAL_TABLET | Freq: Two times a day (BID) | ORAL | 0 refills | Status: DC | PRN

## 2017-01-26 MED ORDER — CYCLOBENZAPRINE HCL 10 MG PO TABS
10.0000 mg | ORAL_TABLET | Freq: Once | ORAL | Status: AC
  Administered 2017-01-26: 10 mg via ORAL
  Filled 2017-01-26: qty 1

## 2017-01-26 MED ORDER — HYDROCODONE-ACETAMINOPHEN 5-325 MG PO TABS: 1.0000 | ORAL_TABLET | Freq: Four times a day (QID) | ORAL | 0 refills | Status: DC | PRN
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
## 2017-01-26 NOTE — Progress Notes (Signed)
Orthopedic Tech Progress Note Patient Details:  Benjamin AhmadiDwayne A Tran Jun 30, 1991 [id number] Level 2 trauma ortho visit. Patient ID: Benjamin Tran, male   DOB: Jun 30, 1991, 26 y.o.   MRN: [id number]   Benjamin Tran, Benjamin Tran Benjamin Tran 01/26/2017, 10:43 PM

## 2017-01-26 NOTE — ED Notes (Signed)
RN paged ortho tech. 
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
## 2017-01-31 ENCOUNTER — Other Ambulatory Visit (HOSPITAL_COMMUNITY): Payer: Self-pay | Admitting: Orthopedic Surgery

## 2017-01-31 DIAGNOSIS — S62012A Displaced fracture of distal pole of navicular [scaphoid] bone of left wrist, initial encounter for closed fracture: Secondary | ICD-10-CM

## 2017-02-06 ENCOUNTER — Ambulatory Visit (HOSPITAL_COMMUNITY): Admission: RE | Admit: 2017-02-06 | Payer: Self-pay | Source: Ambulatory Visit

## 2017-10-12 ENCOUNTER — Emergency Department (HOSPITAL_COMMUNITY): Payer: Self-pay

## 2017-10-12 ENCOUNTER — Encounter (HOSPITAL_COMMUNITY): Payer: Self-pay | Admitting: *Deleted

## 2017-10-12 ENCOUNTER — Emergency Department (HOSPITAL_COMMUNITY)
Admission: EM | Admit: 2017-10-12 | Discharge: 2017-10-12 | Disposition: A | Discharge status: Home / Self Care | Payer: Self-pay | Attending: Emergency Medicine | Admitting: Emergency Medicine

## 2017-10-12 DIAGNOSIS — L0291 Cutaneous abscess, unspecified: Secondary | ICD-10-CM

## 2017-10-12 DIAGNOSIS — M25511 Pain in right shoulder: Secondary | ICD-10-CM

## 2017-10-12 DIAGNOSIS — M71011 Abscess of bursa, right shoulder: Secondary | ICD-10-CM | POA: Insufficient documentation

## 2017-10-12 MED ORDER — LIDOCAINE HCL (PF) 1 % IJ SOLN
2.0000 mL | Freq: Once | INTRAMUSCULAR | Status: AC
  Administered 2017-10-12: 2 mL
  Filled 2017-10-12: qty 5

## 2017-10-12 NOTE — Discharge Instructions (Signed)
Please read and follow all provided instructions.  You have been seen today for right shoulder pain  Tests performed today include: An x-ray of the affected area - does NOT show any broken bones or dislocations.  Vital signs. See below for your results today.   Home care instructions: -- *PRICE in the first 24-48 hours after injury: Protect (with brace, splint, sling), if given by your provider - take your arm out of the sling multiple times per day and perform range of motion exercises.  Rest Ice- Do not apply ice pack directly to your skin, place towel or similar between your skin and ice/ice pack. Apply ice for 20 min, then remove for 40 min while awake Compression- Wear brace, elastic bandage, splint as directed by your provider Elevate affected extremity above the level of your heart when not walking around for the first 24-48 hours   Use Ibuprofen (Motrin/Advil) 600mg  every 6 hours as needed for pain (do not exceed max dose in 24 hours, 2400mg )  Follow-up instructions: Please follow-up with your primary care provider or the provided orthopedic physician (bone specialist) if you continue to have significant pain in 1 week. In this case you may have a more severe injury that requires further care.   Return instructions:  Please return if your toes or feet are numb or tingling, appear gray or blue, or you have severe pain (also elevate the leg and loosen splint or wrap if you were given one) Please return to the Emergency Department if you experience worsening symptoms.  Please return if you have any other emergent concerns.  Abscess Please read and follow all provided instructions.  You were seen here today for an Abscess. For this, an incision and drainage (aka an I&D) to the affected area was done today. An I&D is a surgical procedure to open and drain a fluid-filled sac that may be filled with pus, mucus, or blood. Examples of fluid-filled sacs that may need surgical drainage  include cysts, skin infections (abscesses), and red lumps that develop from a ruptured cyst or a small abscess (boils).  Home instructions  Treatment: Keep wound clean and dry. Apply warm compresses to throughout the day. It will continue to drain over the follow days.  Pain control as above.   Follow Up:  [facility] for wound recheck.  Return to emergency department for emergent changing or worsening symptoms.  Return instructions:  Return to the Emergency Department if you have: Fever You have more redness, swelling, or pain around your incision.  Your incision feels warm to touch Redness of the skin that moves away from the affected area, especially if it streaks away from the affected area  The area where the incision and drainage occurred becomes numb or it tingles. Any other emergent concerns  Your vital signs today were: BP 119/72 (BP Location: Left Arm)    Pulse 61    Temp 98.4 F (36.9 C) (Oral)    Resp 18    SpO2 100%  If your blood pressure (BP) was elevated above 135/85 this visit, please have this repeated by your doctor within one month. ---------------

## 2017-10-12 NOTE — ED Triage Notes (Signed)
Pt reports having right arm pain for several days, unsure about any injury. Pain increases with movement. Pt also has bump/abscess to left lower leg. No acute distress is noted.

## 2017-10-12 NOTE — ED Notes (Signed)
Patient returned from X-ray 

## 2017-10-12 NOTE — ED Notes (Signed)
Patient transported to X-ray 

## 2017-10-12 NOTE — ED Provider Notes (Signed)
[facility]   CSN: [id number] Arrival date & time: 10/12/17  1813     History   Chief Complaint Chief Complaint  Patient presents with  . Arm Pain  . Leg Pain    HPI Laurel A Jacobsen is a 26 y.o. male with no significant past medical history who presents to emergency department today for right arm pain and right leg pain.  Patient states that 2 days ago he was bitten by a bug. He now has an area of swelling and pain. He has not taken anything for this. He denies fever or surrounding erythema. No drainage.   Patient states that he is also having right arm pain over the week. 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 He denies trauma but feels that the pain has been gradually increasing over some time. The pain is only brought on with overhead movements. He describes the pain as a pulling sensation "in my muscles". He has not taken anything for this. No pain at rest. Denies radiation of the pain down the arm, numbness/tingiling/weakness of the extremity. No neck pain, HA, or visual changes. No swelling or erythema over the shoulder. No restrictions in ROM.   HPI  History reviewed. No pertinent past medical history.  There are no active problems to display for this patient.   History reviewed. No pertinent surgical history.     Home Medications    Prior to Admission medications   Medication Sig Start Date End Date Taking? Authorizing Provider  cyclobenzaprine (FLEXERIL) 10 MG tablet Take 1 tablet (10 mg total) by mouth 2 (two) times daily as needed for muscle spasms. 01/26/17   Gwyneth SproutPlunkett, Whitney, MD  HYDROcodone-acetaminophen (NORCO/VICODIN) 5-325 MG tablet Take 1 tablet by mouth every 6 (six) hours as needed. 01/26/17   Gwyneth SproutPlunkett, Whitney, MD  ibuprofen (ADVIL,MOTRIN) 600 MG tablet Take 1 tablet (600 mg total) by mouth every 6 (six) hours as needed. 02/06/16   Cartner, Sharlet SalinaBenjamin, PA-C  predniSONE  (DELTASONE) 20 MG tablet Take 2 tablets (40 mg total) by mouth daily. 10/02/14   Jaynie CrumbleKirichenko, Tatyana, PA-C    Family History History reviewed. No pertinent family history.  Social History Social History  Substance Use Topics  . Smoking status: Never Smoker  . Smokeless tobacco: Never Used  . Alcohol use Yes     Comment: occassionally     Allergies   Penicillins   Review of Systems Review of Systems  Constitutional: Negative for chills and fever.  Gastrointestinal: Negative for nausea and vomiting.  Musculoskeletal: Positive for arthralgias.  Skin: Positive for wound.  Neurological: Negative for weakness and numbness.     Physical Exam Updated Vital Signs BP 119/72 (BP Location: Left Arm)   Pulse 61   Temp 98.4 F (36.9 C) (Oral)   Resp 18   SpO2 100%   Physical Exam  Constitutional: He appears well-developed and well-nourished.  HENT:  Head: Normocephalic and atraumatic.  Right Ear: External ear normal.  Left Ear: External ear normal.  Eyes: Conjunctivae are normal. Right eye exhibits no discharge. Left eye exhibits no discharge. No scleral icterus.  Cardiovascular: Normal rate and regular rhythm.   Pulses:      Radial pulses are 2+ on the right side.       Dorsalis pedis pulses are 2+ on the right side.       Posterior tibial pulses are 2+ on the right side.  Pulmonary/Chest: Effort normal and  breath sounds normal. No respiratory distress.  Musculoskeletal:  Cervical Spine: Appearance normal. No obvious bony deformity. No skin swelling, erythema, heat, fluctuance or break of the skin. No TTP over the cervical spinous processes. No paraspinal tenderness. No step-offs. Patient is able to actively rotate their neck 45 degrees left and right voluntarily without pain and flex and extend the neck without pain. Negative Spurling's and Cervical Load test.  Right Shoulder: Appearance normal. No obvious bony deformity. No skin swelling, erythema, heat, fluctuance or break  of the skin. No clavicular deformity or TTP. TTP over anterior shoulder. Active and passive flexion, extension, abduction, adduction, and internal/external rotation  intact without crepitus. Strength for flexion, extension, abduction, adduction, and internal/external rotation intact and appropriate for age. Positive Hawkin's and Neer's test. Negative Adson's test.  Right Elbow: Appearance normal. No obvious bony deformity. No skin swelling, erythema, heat, fluctuance or break of the skin. No TTP over joint. Active flexion, extension, supination and pronation full and intact without pain. Strength able and appropriate for age for flexion and extension.  Radial Pulse 2+. Cap refill <2 seconds. SILT for M/U/R distributions. Compartments soft.    Neurological: He is alert. He has normal strength. No sensory deficit.  Skin: Skin is warm and dry. Capillary refill takes less than 2 seconds. No pallor.  Right lower leg with 0.5cm area of fluctuance. No surronding erythema or induration. No heat. No drainage.   Psychiatric: He has a normal mood and affect.  Nursing note and vitals reviewed.    ED Treatments / Results  Labs (all labs ordered are listed, but only abnormal results are displayed) Labs Reviewed - No data to display  EKG  EKG Interpretation None       Radiology No results found.  Procedures .Marland KitchenIncision and Drainage Date/Time: 10/12/2017 9:19 PM Performed by: Jacinto Halim Authorized by: Jacinto Halim   Consent:    Consent obtained:  Verbal   Consent given by:  Patient   Risks discussed:  Bleeding, damage to other organs, infection, incomplete drainage and pain   Alternatives discussed:  No treatment Location:    Type:  Abscess   Size:  .5cm   Location:  Lower extremity   Lower extremity location:  Leg   Leg location:  R lower leg Pre-procedure details:    Skin preparation:  Betadine Anesthesia (see MAR for exact dosages):    Anesthesia method:  Local  infiltration   Local anesthetic:  Lidocaine 1% w/o epi Procedure type:    Complexity:  Simple Procedure details:    Needle aspiration: yes     Needle size:  25 G   Drainage:  Bloody and purulent   Drainage amount:  Scant   Wound treatment:  Wound left open   Packing materials:  None Post-procedure details:    Patient tolerance of procedure:  Tolerated well, no immediate complications   (including critical care time)  Medications Ordered in ED Medications - No data to display   Initial Impression / Assessment and Plan / ED Course  I have reviewed the triage vital signs and the nursing notes.  Pertinent labs & imaging results that were available during my care of the patient were reviewed by me and considered in my medical decision making (see chart for details).     Patient here with right shoulder pain. Patient X-Ray negative for obvious fracture or dislocation. Patient exam concerning for shoulder imingment with positive Hawkins and Neers test. This is with patient's history of the  patient does painting overhead frequently. Patient denied pain medication in the ED. Pt advised to follow up with PCP vs orthopedics. Patient given slingwhile in ED, conservative therapy recommended and discussed. Patient advised to take his arm out of the sling multiple times per day and perform range of motion exercises in order to prevent frozen shoulder. Patient given return precautions. Patient will be dc home & is agreeable with above plan.  Patient with skin abscess amenable to incision and drainage.  Abscess was not large enough to warrant packing or drain,  wound recheck in 2 days. Encouraged home warm soaks and flushing.  Mild to no signs of cellulitis is surrounding skin.  Will d/c to home.  No antibiotic therapy is indicated.  Final Clinical Impressions(s) / ED Diagnoses   Final diagnoses:  None    New Prescriptions New Prescriptions   No medications on file     Princella Pellegrini 10/12/17 2126    Nira Conn, MD 10/13/17 0028

## 2017-10-16 ENCOUNTER — Ambulatory Visit (HOSPITAL_COMMUNITY)
Admission: EM | Admit: 2017-10-16 | Discharge: 2017-10-16 | Disposition: A | Discharge status: Home / Self Care | Payer: Self-pay | Attending: Family Medicine | Admitting: Family Medicine

## 2017-10-16 ENCOUNTER — Encounter (HOSPITAL_COMMUNITY): Payer: Self-pay | Admitting: Emergency Medicine

## 2017-10-16 DIAGNOSIS — L089 Local infection of the skin and subcutaneous tissue, unspecified: Secondary | ICD-10-CM

## 2017-10-16 DIAGNOSIS — R609 Edema, unspecified: Secondary | ICD-10-CM | POA: Insufficient documentation

## 2017-10-16 DIAGNOSIS — S81802A Unspecified open wound, left lower leg, initial encounter: Secondary | ICD-10-CM | POA: Insufficient documentation

## 2017-10-16 DIAGNOSIS — Z880 Allergy status to penicillin: Secondary | ICD-10-CM | POA: Insufficient documentation

## 2017-10-16 DIAGNOSIS — T148XXA Other injury of unspecified body region, initial encounter: Secondary | ICD-10-CM

## 2017-10-16 MED ORDER — CEPHALEXIN 500 MG PO CAPS: 500.0000 mg | ORAL_CAPSULE | Freq: Four times a day (QID) | ORAL | 0 refills | Status: DC

## 2017-10-16 NOTE — ED Triage Notes (Signed)
Pt had an I&D done on his left shin 4 days ago in the ED.  Pt is concerned about the wound at this time.  He has not been on any antibiotics, but reports using warm compresses as instructed.

## 2017-10-16 NOTE — Discharge Instructions (Signed)
Keep clean with warm soap and water. Continue to apply warm compresses. Take the antibiotics as directed. If not getting better in a couple days or if getting worse may return.

## 2017-10-16 NOTE — ED Provider Notes (Signed)
[facility]    CSN: [id number] Arrival date & time: 10/16/17  1414     History   Chief Complaint Chief Complaint  Patient presents with  . Insect Bite    HPI Benjamin Tran is a 26 y.o. male.   26 year old male was seen in the emergency department on October 21 for a small wound to the left shin. It was described as a 5 mm lesion that was superficially fluctuant. The wound was pierced and a small amount of purulence was drained. A dressing applied and the patient was advised to apply warm compresses for which he states he has. No medications prescribed.  He presents today for wound check stating he feels like it may be a little bit worse. There is been some swelling surrounding the anterior shin and he has been expressing some purulence from the wound. Wound is now approximately 1 full centimeter in diameter and has a depth of about 4 mm. There is no obvious purulence from the wound. It is moist with a serous type drainage and a scant amount of thicker white fluid.      History reviewed. No pertinent past medical history.  There are no active problems to display for this patient.   History reviewed. No pertinent surgical history.     Home Medications    Prior to Admission medications   Medication Sig Start Date End Date Taking? Authorizing Provider  cephALEXin (KEFLEX) 500 MG capsule Take 1 capsule (500 mg total) by mouth 4 (four) times daily. 10/16/17   Hayden RasmussenMabe, Amairani Shuey, NP    Family History History reviewed. No pertinent family history.  Social History Social History  Substance Use Topics  . Smoking status: Never Smoker  . Smokeless tobacco: Never Used  . Alcohol use Yes     Comment: occassionally     Allergies   Penicillins   Review of Systems Review of Systems  Constitutional: Negative.   Respiratory: Negative.   Skin: Positive for wound.  All other systems reviewed and are negative.    Physical Exam Triage Vital Signs ED Triage  Vitals [10/16/17 1433]  Enc Vitals Group     BP (!) 105/56     Pulse Rate 65     Resp      Temp (!) 97.5 F (36.4 C)     Temp Source Oral     SpO2 99 %     Weight      Height      Head Circumference      Peak Flow      Pain Score 7     Pain Loc      Pain Edu?      Excl. in GC?    No data found.   Updated Vital Signs BP (!) 105/56 (BP Location: Left Arm)   Pulse 65   Temp (!) 97.5 F (36.4 C) (Oral)   SpO2 99%   Visual Acuity Right Eye Distance:   Left Eye Distance:   Bilateral Distance:    Right Eye Near:   Left Eye Near:    Bilateral Near:     Physical Exam  Constitutional: He is oriented to person, place, and time. He appears well-developed and well-nourished.  Neck: Neck supple.  Cardiovascular: Normal rate.   Pulmonary/Chest: Effort normal.  Musculoskeletal: Normal range of motion.  Neurological: He is alert and oriented to person, place, and time.  Skin: Skin is warm and dry.  Wound on the left shin described in history  of present illness. There is mild puffiness surrounding the wound by 7-8 cm. Radius. Does not appear to be fluctuant. No erythema to the leg. Positive for tenderness.  Nursing note and vitals reviewed.    UC Treatments / Results  Labs (all labs ordered are listed, but only abnormal results are displayed) Labs Reviewed  AEROBIC CULTURE (SUPERFICIAL SPECIMEN)    EKG  EKG Interpretation None       Radiology No results found.  Procedures Procedures (including critical care time)  Medications Ordered in UC Medications - No data to display   Initial Impression / Assessment and Plan / UC Course  I have reviewed the triage vital signs and the nursing notes.  Pertinent labs & imaging results that were available during my care of the patient were reviewed by me and considered in my medical decision making (see chart for details).    Keep clean with warm soap and water. Continue to apply warm compresses. Take the antibiotics  as directed. If not getting better in a couple days or if getting worse may return.    Final Clinical Impressions(s) / UC Diagnoses   Final diagnoses:  Wound infection    New Prescriptions New Prescriptions   CEPHALEXIN (KEFLEX) 500 MG CAPSULE    Take 1 capsule (500 mg total) by mouth 4 (four) times daily.     Controlled Substance Prescriptions  Controlled Substance Registry consulted? Not Applicable   Hayden Rasmussen, NP 10/16/17 [phone]

## 2017-10-19 LAB — AEROBIC CULTURE W GRAM STAIN (SUPERFICIAL SPECIMEN)

## 2017-10-19 LAB — AEROBIC CULTURE  (SUPERFICIAL SPECIMEN)

## 2017-10-20 ENCOUNTER — Telehealth: Payer: Self-pay

## 2017-10-20 NOTE — Telephone Encounter (Signed)
Post ED Visit - Positive Culture Follow-up  Culture report reviewed by antimicrobial stewardship pharmacist:  []  Enzo BiNathan Batchelder, Pharm.D. []  Celedonio MiyamotoJeremy Frens, [address]Ph[address] BCPS AQ-ID []  Garvin FilaMike Maccia, Pharm.D., BCPS []  Georgina PillionElizabeth Martin, Pharm.D., BCPS []  [place]elloMinh Pham, [address]Pharm[address]D., BCPS, AAHIVP []  Estella HuskMichelle Turner, Pharm.D., BCPS, AAHIVP []  Lysle Pearlachel Rumbarger, PharmD, BCPS []  Casilda Carlsaylor Stone, PharmD, BCPS []  Pollyann SamplesAndy Johnston, PharmD, BCPS [facility] Mancheril Pharm D Positive aerobic culture Treated with Cephalexin, organism sensitive to the same and no further patient follow-up is required at this time.  Jerry CarasCullom, Spero Gunnels Burnett 10/20/2017, 10:03 AM

## 2018-06-28 ENCOUNTER — Emergency Department (HOSPITAL_COMMUNITY)
Admission: EM | Admit: 2018-06-28 | Discharge: 2018-06-28 | Disposition: A | Discharge status: Home / Self Care | Payer: Self-pay | Attending: Emergency Medicine | Admitting: Emergency Medicine

## 2018-06-28 ENCOUNTER — Encounter (HOSPITAL_COMMUNITY): Payer: Self-pay | Admitting: Emergency Medicine

## 2018-06-28 DIAGNOSIS — K644 Residual hemorrhoidal skin tags: Secondary | ICD-10-CM

## 2018-06-28 DIAGNOSIS — K649 Unspecified hemorrhoids: Secondary | ICD-10-CM | POA: Insufficient documentation

## 2018-06-28 LAB — COMPREHENSIVE METABOLIC PANEL
ALBUMIN: 4 g/dL (ref 3.5–5.0)
ALT: 17 U/L (ref 0–44)
AST: 21 U/L (ref 15–41)
Alkaline Phosphatase: 37 U/L — ABNORMAL LOW (ref 38–126)
Anion gap: 6 (ref 5–15)
BUN: 10 mg/dL (ref 6–20)
CO2: 29 mmol/L (ref 22–32)
CREATININE: 0.95 mg/dL (ref 0.61–1.24)
Calcium: 9 mg/dL (ref 8.9–10.3)
Chloride: 106 mmol/L (ref 98–111)
GFR calc Af Amer: >60 mL/min (ref >60)
GLUCOSE: 99 mg/dL (ref 70–99)
POTASSIUM: 4.1 mmol/L (ref 3.5–5.1)
Sodium: 141 mmol/L (ref 135–145)
Total Bilirubin: 0.6 mg/dL (ref 0.3–1.2)
Total Protein: 6.9 g/dL (ref 6.5–8.1)

## 2018-06-28 LAB — CBC
HCT: 39.7 % (ref 39.0–52.0)
Hemoglobin: 13.2 g/dL (ref 13.0–17.0)
MCH: 30.6 pg (ref 26.0–34.0)
MCHC: 33.2 g/dL (ref 30.0–36.0)
MCV: 92.1 fL (ref 78.0–100.0)
Platelets: 245 10*3/uL (ref 150–400)
RBC: 4.31 MIL/uL (ref 4.22–5.81)
RDW: 12.3 % (ref 11.5–15.5)
WBC: 4.8 10*3/uL (ref 4.0–10.5)

## 2018-06-28 LAB — TYPE AND SCREEN
ABO/RH(D): AB POS
Antibody Screen: NEGATIVE

## 2018-06-28 LAB — ABO/RH: ABO/RH(D): AB POS

## 2018-06-28 MED ORDER — HYDROCORTISONE 2.5 % RE CREA: TOPICAL_CREAM | RECTAL | 0 refills | Status: DC

## 2018-06-28 NOTE — ED Notes (Signed)
External hemorrhoids noted x2, larger one noted to have small amt of dried blood to area with small open area present - no active bleeding

## 2018-06-28 NOTE — ED Triage Notes (Signed)
Pt reports seeing bright red blood when having a bowel movement yesterday and today. States he saw blood in the toilet and when he wiped, denies abd pain. Denies any otc NSAID use

## 2018-06-28 NOTE — ED Provider Notes (Signed)
[name]   CSN: [id number] Arrival date & time: 06/28/18  0911     History   Chief Complaint Chief Complaint  Patient presents with  . GI Bleeding    HPI Benjamin Tran is a 27 y.o. male.  Patient presents the emergency department today with complaint of blood in the stool.  Patient has had multiple episodes of bright red blood over the past day.  He has had a small amount of bleeding even when he does not have a bowel movement.  He has not had symptoms like this in the past.  He denies rectal pain or excessive straining with bowel movements.  He denies bleeding from the gums, easy bruising.  He is not on any blood thinners.  He drinks alcohol on the weekends.  He denies heavy NSAID or aspirin use.  No black or tarry stools.  No lightheadedness or syncope.  No shortness of breath or chest pain. The onset of this condition was acute. The course is intermittent. Aggravating factors: none. Alleviating factors: none.       History reviewed. No pertinent past medical history.  There are no active problems to display for this patient.   History reviewed. No pertinent surgical history.      Home Medications    Prior to Admission medications   Medication Sig Start Date End Date Taking? Authorizing Provider  cephALEXin (KEFLEX) 500 MG capsule Take 1 capsule (500 mg total) by mouth 4 (four) times daily. 10/16/17   Hayden RasmussenMabe, David, NP    Family History No family history on file.  Social History Social History   Tobacco Use  . Smoking status: Never Smoker  . Smokeless tobacco: Never Used  Substance Use Topics  . Alcohol use: Yes    Comment: occassionally  . Drug use: Yes    Types: Marijuana    Comment: last use today     Allergies   Penicillins   Review of Systems Review of Systems  Constitutional: Negative for fever.  HENT: Negative for sore throat.   Respiratory: Negative for shortness of breath.     Cardiovascular: Negative for chest pain.  Gastrointestinal: Positive for blood in stool. Negative for abdominal pain, diarrhea, nausea, rectal pain and vomiting.  Genitourinary: Negative for hematuria.  Musculoskeletal: Negative for myalgias.  Skin: Negative for rash.  Neurological: Negative for light-headedness.  Hematological: Does not bruise/bleed easily.     Physical Exam Updated Vital Signs BP 125/86 (BP Location: Right Arm)   Pulse 62   Temp 98.6 F (37 C) (Oral)   Resp 16   SpO2 100%   Physical Exam  Constitutional: He appears well-developed and well-nourished.  HENT:  Head: Normocephalic and atraumatic.  Eyes: Conjunctivae are normal. Right eye exhibits no discharge. Left eye exhibits no discharge.  Neck: Normal range of motion. Neck supple.  Cardiovascular: Normal rate, regular rhythm and normal heart sounds.  Pulmonary/Chest: Effort normal and breath sounds normal.  Abdominal: Soft. There is no tenderness.  Genitourinary: Rectal exam shows external hemorrhoid (2 hemorrhoids noted, the larger of which is inflammed. No active bleeding.).  Neurological: He is alert.  Skin: Skin is warm and dry.  Psychiatric: He has a normal mood and affect.  Nursing note and vitals reviewed.    ED Treatments / Results  Labs (all labs ordered are listed, but only abnormal results are displayed) Labs Reviewed  COMPREHENSIVE METABOLIC PANEL - Abnormal; Notable for the following components:  Result Value   Alkaline Phosphatase 37 (*)    All other components within normal limits  CBC  TYPE AND SCREEN  ABO/RH    EKG None  Radiology No results found.  Procedures Procedures (including critical care time)  Medications Ordered in ED Medications - No data to display   Initial Impression / Assessment and Plan / ED Course  I have reviewed the triage vital signs and the nursing notes.  Pertinent labs & imaging results that were available during my care of the patient  were reviewed by me and considered in my medical decision making (see chart for details).     Patient seen and examined. External exam undertaken with RN chaperone. + hemorrhoids and likely source noted. Will f/u on lab work that was ordered in triage.   Vital signs reviewed and are as follows: BP 125/86 (BP Location: Right Arm)   Pulse 62   Temp 98.6 F (37 C) (Oral)   Resp 16   SpO2 100%   11:25 AM lab work is reassuring.  We will discharged home with Anusol cream.  Patient encouraged to increase fiber in the diet.  We discussed sitz bath's.  Encourage PCP follow-up as needed.  Final Clinical Impressions(s) / ED Diagnoses   Final diagnoses:  External hemorrhoid, bleeding   Patient with bleeding, likely source being external hemorrhoid noted on exam.  No thrombosed hemorrhoids noted.  Normal hemoglobin.  Normal vital signs.  Will treat conservatively and have patient follow-up as needed.  ED Discharge Orders        Ordered    hydrocortisone (ANUSOL-HC) 2.5 % rectal cream     06/28/18 1123       Renne Crigler, PA-C 06/28/18 1126    Wynetta Fines, MD 06/29/18 [phone]

## 2018-06-28 NOTE — Discharge Instructions (Signed)
Please read and follow all provided instructions.  Your diagnoses today include:  1. External hemorrhoid, bleeding     Tests performed today include:  Blood counts and electrolytes  Vital signs. See below for your results today.   Medications prescribed:   Steroid cream for inflamed hemorrhoids  Take any prescribed medications only as directed.  Home care instructions:  Follow any educational materials contained in this packet.  BE VERY CAREFUL not to take multiple medicines containing Tylenol (also called acetaminophen). Doing so can lead to an overdose which can damage your liver and cause liver failure and possibly death.   Follow-up instructions: Please follow-up with your primary care provider in the next 3 days for further evaluation of your symptoms.   Return instructions:   Please return to the Emergency Department if you experience worsening symptoms.   Return with abdominal pain, worsening rectal pain, or heavier bleeding.  Please return if you have any other emergent concerns.  Additional Information:  Your vital signs today were: BP 125/86 (BP Location: Right Arm)    Pulse 62    Temp 98.6 F (37 C) (Oral)    Resp 16    SpO2 100%  If your blood pressure (BP) was elevated above 135/85 this visit, please have this repeated by your doctor within one month. --------------

## 2019-03-19 ENCOUNTER — Emergency Department (HOSPITAL_COMMUNITY)
Admission: EM | Admit: 2019-03-19 | Discharge: 2019-03-19 | Disposition: A | Discharge status: Home / Self Care | Payer: Self-pay | Attending: Emergency Medicine | Admitting: Emergency Medicine

## 2019-03-19 ENCOUNTER — Encounter (HOSPITAL_COMMUNITY): Payer: Self-pay | Admitting: Emergency Medicine

## 2019-03-19 DIAGNOSIS — Z2914 Encounter for prophylactic rabies immune globin: Secondary | ICD-10-CM | POA: Insufficient documentation

## 2019-03-19 DIAGNOSIS — Y999 Unspecified external cause status: Secondary | ICD-10-CM | POA: Insufficient documentation

## 2019-03-19 DIAGNOSIS — Y929 Unspecified place or not applicable: Secondary | ICD-10-CM | POA: Insufficient documentation

## 2019-03-19 DIAGNOSIS — W540XXA Bitten by dog, initial encounter: Secondary | ICD-10-CM | POA: Insufficient documentation

## 2019-03-19 DIAGNOSIS — S21159A Open bite of unspecified front wall of thorax without penetration into thoracic cavity, initial encounter: Secondary | ICD-10-CM | POA: Insufficient documentation

## 2019-03-19 DIAGNOSIS — Y939 Activity, unspecified: Secondary | ICD-10-CM | POA: Insufficient documentation

## 2019-03-19 MED ORDER — RABIES VACCINE, PCEC IM SUSR
1.0000 mL | Freq: Once | INTRAMUSCULAR | Status: AC
  Administered 2019-03-19: 1 mL via INTRAMUSCULAR
  Filled 2019-03-19: qty 1

## 2019-03-19 MED ORDER — RABIES IMMUNE GLOBULIN 150 UNIT/ML IM INJ
20.0000 [IU]/kg | INJECTION | Freq: Once | INTRAMUSCULAR | Status: AC
  Administered 2019-03-19: 1350 [IU] via INTRAMUSCULAR
  Filled 2019-03-19: qty 9

## 2019-03-19 NOTE — ED Triage Notes (Signed)
  Patient comes in with dog bite to R thigh and anterior chest.  Patient states the dog lives in the neighborhood and was trying to contain the dog and it attacked him.  Patient states it is not his dog and does not know if it has its shots.  ETOH on board.  Pain 7/10.  Bleeding controlled.  Patient A&O x4

## 2019-03-19 NOTE — ED Provider Notes (Signed)
[facility] EMERGENCY DEPARTMENT Provider Note   CSN: [id number] Arrival date & time: 03/19/19  1918    History   Chief Complaint Chief Complaint  Patient presents with  . Animal Bite    HPI Benjamin Tran is a 28 y.o. male.     28 y.o male with no PMH presents to the ED s/p animal bite x1 hour ago.  Patient reports he was bit by a pit bull who happens to be a stray dog the rounds around his mother's neighborhood.  He reports the pitbull was trying to go towards children and he stepped in front of the pitbull, he then reports pitbull bit his chest then proceeded to bit his thigh.  No medical therapy for relieving symptoms.  Patient unsure of tetanus status.  He is unsure whether this stray dog has his vaccinations.  He denies any other injury or complaint.  Patient reports drinking tonight.     History reviewed. No pertinent past medical history.  There are no active problems to display for this patient.   History reviewed. No pertinent surgical history.      Home Medications    Prior to Admission medications   Medication Sig Start Date End Date Taking? Authorizing Provider  hydrocortisone (ANUSOL-HC) 2.5 % rectal cream Apply rectally 2 times daily Patient not taking: Reported on 03/19/2019 06/28/18   Renne Crigler, PA-C    Family History History reviewed. No pertinent family history.  Social History Social History   Tobacco Use  . Smoking status: Never Smoker  . Smokeless tobacco: Never Used  Substance Use Topics  . Alcohol use: Yes    Comment: occassionally  . Drug use: Yes    Types: Marijuana    Comment: last use today     Allergies   Penicillins   Review of Systems Review of Systems  Constitutional: Negative for fever.  HENT: Negative for sore throat.   Eyes: Negative for photophobia.  Respiratory: Negative for shortness of breath.   Cardiovascular: Negative for chest pain.  Gastrointestinal: Negative for abdominal pain.   Genitourinary: Negative for flank pain.  Musculoskeletal: Negative for myalgias.  Skin: Positive for wound.  Neurological: Negative for headaches.  Psychiatric/Behavioral: Negative for agitation.     Physical Exam Updated Vital Signs BP 124/85 (BP Location: Right Arm)   Pulse (!) 106   Temp 98.2 F (36.8 C) (Oral)   Resp 18   Ht 5\' 11"  (1.803 m)   Wt 68 kg   SpO2 99%   BMI 20.92 kg/m   Physical Exam Vitals signs and nursing note reviewed.  Constitutional:      Appearance: He is well-developed.  HENT:     Head: Normocephalic and atraumatic.  Eyes:     General: No scleral icterus.    Pupils: Pupils are equal, round, and reactive to light.  Neck:     Musculoskeletal: Normal range of motion.  Cardiovascular:     Heart sounds: Normal heart sounds.  Pulmonary:     Effort: Pulmonary effort is normal.     Breath sounds: Normal breath sounds. No wheezing.  Chest:     Chest wall: No tenderness.  Abdominal:     General: Bowel sounds are normal. There is no distension.     Palpations: Abdomen is soft.     Tenderness: There is no abdominal tenderness.  Musculoskeletal:        General: No tenderness or deformity.  Skin:    General: Skin is warm and  dry.     Findings: Abrasion and wound present.          Comments: Please see photos attached.  Neurological:     Mental Status: He is alert and oriented to person, place, and time.          ED Treatments / Results  Labs (all labs ordered are listed, but only abnormal results are displayed) Labs Reviewed - No data to display  EKG None  Radiology No results found.  Procedures Procedures (including critical care time)  Medications Ordered in ED Medications  rabies immune globulin (HYPERAB/KEDRAB) injection 1,350 Units (has no administration in time range)  rabies vaccine (RABAVERT) injection 1 mL (has no administration in time range)     Initial Impression / Assessment and Plan / ED Course  I have reviewed  the triage vital signs and the nursing notes.  Pertinent labs & imaging results that were available during my care of the patient were reviewed by me and considered in my medical decision making (see chart for details).       Patient presents with dog bite by street dog x 1 hour.  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  When asked if dog could be quarantined for 14 days patient reports his dog walks around the neighborhood.  Patient reports he went up to dog to prevent him from going towards children.  Dog bit him on his chest along with thigh.  Tetanus is unknown per patient but according to patient's chart under immunizations, his last Tdap was given on 2016.   Due to being unable to quarantine the dog at this time will start patient on rabies vaccines, he will receive 2 vaccines during this visit along with follow-up at urgent care for the remaining 3 vaccines. Instructions on when to return have been given to him.  Patient's abrasions seem to be superficial in nature, will extensively irrigate.  He will not be placed on prophylactic antibiotic treatment as patient is under low risk category.  Patient is advised to have urgent care reassess his wound during his future visits.  Patient intoxicated during ED visit.  Will provide him with paperwork with instructions.   Final Clinical Impressions(s) / ED Diagnoses   Final diagnoses:  Dog bite, initial encounter    ED Discharge Orders    None       Claude Manges, PA-C 03/24/19 1418    Mesner, Barbara Cower, MD 03/24/19 [phone]

## 2019-03-19 NOTE — Discharge Instructions (Addendum)
We have provided you with the first series of vaccines to prevent rabies, you will need an additional 3 doses.  You may receive these doses at the urgent care located by [facility], ER.  Please have your wound reevaluated when going to receive your vaccines.  You may clean this at home with water and soap along with applying Neosporin.

## 2019-03-19 NOTE — ED Notes (Signed)
Discharge instructions discussed with Pt. Pt verbalized understanding. Pt stable and ambulatory.    

## 2019-03-22 ENCOUNTER — Ambulatory Visit (HOSPITAL_COMMUNITY)
Admission: EM | Admit: 2019-03-22 | Discharge: 2019-03-22 | Disposition: A | Discharge status: Home / Self Care | Payer: Self-pay | Attending: Family Medicine | Admitting: Family Medicine

## 2019-03-22 DIAGNOSIS — Z203 Contact with and (suspected) exposure to rabies: Secondary | ICD-10-CM

## 2019-03-22 DIAGNOSIS — Z23 Encounter for immunization: Secondary | ICD-10-CM

## 2019-03-22 MED ORDER — RABIES VACCINE, PCEC IM SUSR
1.0000 mL | Freq: Once | INTRAMUSCULAR | Status: AC
  Administered 2019-03-22: 1 mL via INTRAMUSCULAR

## 2019-03-22 MED ORDER — RABIES VACCINE, PCEC IM SUSR
INTRAMUSCULAR | Status: AC
  Filled 2019-03-22: qty 1

## 2019-03-22 NOTE — ED Triage Notes (Signed)
Pt here for a Rabies injection.

## 2019-03-22 NOTE — ED Notes (Signed)
Pt got his rabies injection today in his left deltoid.

## 2019-03-26 ENCOUNTER — Ambulatory Visit (HOSPITAL_COMMUNITY)
Admission: EM | Admit: 2019-03-26 | Discharge: 2019-03-26 | Disposition: A | Discharge status: Home / Self Care | Payer: Self-pay | Attending: Family Medicine | Admitting: Family Medicine

## 2019-03-26 DIAGNOSIS — Z23 Encounter for immunization: Secondary | ICD-10-CM

## 2019-03-26 DIAGNOSIS — Z203 Contact with and (suspected) exposure to rabies: Secondary | ICD-10-CM

## 2019-03-26 MED ORDER — RABIES VACCINE, PCEC IM SUSR
1.0000 mL | Freq: Once | INTRAMUSCULAR | Status: AC
  Administered 2019-03-26: 15:00:00 1 mL via INTRAMUSCULAR

## 2019-03-26 MED ORDER — RABIES VACCINE, PCEC IM SUSR
INTRAMUSCULAR | Status: AC
  Filled 2019-03-26: qty 1

## 2019-03-26 NOTE — ED Notes (Signed)
Patient here for rabies vaccine admin.

## 2019-04-02 ENCOUNTER — Ambulatory Visit (HOSPITAL_COMMUNITY)
Admission: EM | Admit: 2019-04-02 | Discharge: 2019-04-02 | Disposition: A | Discharge status: Home / Self Care | Payer: Self-pay | Attending: Family Medicine | Admitting: Family Medicine

## 2019-04-02 DIAGNOSIS — Z23 Encounter for immunization: Secondary | ICD-10-CM

## 2019-04-02 DIAGNOSIS — Z203 Contact with and (suspected) exposure to rabies: Secondary | ICD-10-CM

## 2019-04-02 MED ORDER — RABIES VACCINE, PCEC IM SUSR
INTRAMUSCULAR | Status: AC
  Filled 2019-04-02: qty 1

## 2019-04-02 MED ORDER — RABIES VACCINE, PCEC IM SUSR
1.0000 mL | Freq: Once | INTRAMUSCULAR | Status: AC
  Administered 2019-04-02: 1 mL via INTRAMUSCULAR

## 2019-04-02 NOTE — ED Notes (Signed)
Pt here for last rabies shot 

## 2020-11-06 ENCOUNTER — Encounter (HOSPITAL_COMMUNITY): Payer: Self-pay | Admitting: Emergency Medicine

## 2020-11-06 ENCOUNTER — Emergency Department (HOSPITAL_COMMUNITY): Payer: Self-pay

## 2020-11-06 ENCOUNTER — Emergency Department (HOSPITAL_COMMUNITY)
Admission: EM | Admit: 2020-11-06 | Discharge: 2020-11-06 | Disposition: A | Discharge status: Home / Self Care | Payer: Self-pay | Attending: Emergency Medicine | Admitting: Emergency Medicine

## 2020-11-06 ENCOUNTER — Other Ambulatory Visit: Payer: Self-pay

## 2020-11-06 DIAGNOSIS — Z20822 Contact with and (suspected) exposure to covid-19: Secondary | ICD-10-CM | POA: Insufficient documentation

## 2020-11-06 DIAGNOSIS — W3400XA Accidental discharge from unspecified firearms or gun, initial encounter: Secondary | ICD-10-CM | POA: Insufficient documentation

## 2020-11-06 DIAGNOSIS — Y92511 Restaurant or cafe as the place of occurrence of the external cause: Secondary | ICD-10-CM | POA: Insufficient documentation

## 2020-11-06 DIAGNOSIS — Z23 Encounter for immunization: Secondary | ICD-10-CM | POA: Insufficient documentation

## 2020-11-06 DIAGNOSIS — S91301A Unspecified open wound, right foot, initial encounter: Secondary | ICD-10-CM | POA: Insufficient documentation

## 2020-11-06 DIAGNOSIS — J45909 Unspecified asthma, uncomplicated: Secondary | ICD-10-CM | POA: Insufficient documentation

## 2020-11-06 HISTORY — DX: Unspecified asthma, uncomplicated: J45.909

## 2020-11-06 LAB — CBC WITH DIFFERENTIAL/PLATELET
Abs Immature Granulocytes: 0 10*3/uL (ref 0.00–0.07)
Basophils Absolute: 0.1 10*3/uL (ref 0.0–0.1)
Basophils Relative: 2 %
Eosinophils Absolute: 0.2 10*3/uL (ref 0.0–0.5)
Eosinophils Relative: 4 %
HCT: 27.8 % — ABNORMAL LOW (ref 39.0–52.0)
Hemoglobin: 8.6 g/dL — ABNORMAL LOW (ref 13.0–17.0)
Immature Granulocytes: 0 %
Lymphocytes Relative: 24 %
Lymphs Abs: 1.3 10*3/uL (ref 0.7–4.0)
MCH: 24.6 pg — ABNORMAL LOW (ref 26.0–34.0)
MCHC: 30.9 g/dL (ref 30.0–36.0)
MCV: 79.4 fL — ABNORMAL LOW (ref 80.0–100.0)
Monocytes Absolute: 0.6 10*3/uL (ref 0.1–1.0)
Monocytes Relative: 10 %
Neutro Abs: 3.3 10*3/uL (ref 1.7–7.7)
Neutrophils Relative %: 60 %
Platelets: 248 10*3/uL (ref 150–400)
RBC: 3.5 MIL/uL — ABNORMAL LOW (ref 4.22–5.81)
RDW: 15.8 % — ABNORMAL HIGH (ref 11.5–15.5)
WBC: 5.4 10*3/uL (ref 4.0–10.5)
nRBC: 0 % (ref 0.0–0.2)

## 2020-11-06 LAB — RESPIRATORY PANEL BY RT PCR (FLU A&B, COVID)
Influenza A by PCR: NEGATIVE
Influenza B by PCR: NEGATIVE
SARS Coronavirus 2 by RT PCR: NEGATIVE

## 2020-11-06 LAB — I-STAT CHEM 8, ED
BUN: 9 mg/dL (ref 6–20)
Calcium, Ion: 1.1 mmol/L — ABNORMAL LOW (ref 1.15–1.40)
Chloride: 106 mmol/L (ref 98–111)
Creatinine, Ser: 1.1 mg/dL (ref 0.61–1.24)
Glucose, Bld: 96 mg/dL (ref 70–99)
HCT: 28 % — ABNORMAL LOW (ref 39.0–52.0)
Hemoglobin: 9.5 g/dL — ABNORMAL LOW (ref 13.0–17.0)
Potassium: 3.4 mmol/L — ABNORMAL LOW (ref 3.5–5.1)
Sodium: 141 mmol/L (ref 135–145)
TCO2: 22 mmol/L (ref 22–32)

## 2020-11-06 MED ORDER — CLINDAMYCIN PHOSPHATE 600 MG/50ML IV SOLN
600.0000 mg | Freq: Once | INTRAVENOUS | Status: AC
Start: 2020-11-06 — End: 2020-11-06
  Administered 2020-11-06: 600 mg via INTRAVENOUS
  Filled 2020-11-06: qty 50

## 2020-11-06 MED ORDER — CLINDAMYCIN HCL 150 MG PO CAPS: 300.0000 mg | ORAL_CAPSULE | Freq: Three times a day (TID) | ORAL | 0 refills | Status: DC

## 2020-11-06 MED ORDER — IOHEXOL 350 MG/ML SOLN
100.0000 mL | Freq: Once | INTRAVENOUS | Status: AC | PRN
  Administered 2020-11-06: 100 mL via INTRAVENOUS

## 2020-11-06 MED ORDER — TETANUS-DIPHTH-ACELL PERTUSSIS 5-2.5-18.5 LF-MCG/0.5 IM SUSY
0.5000 mL | PREFILLED_SYRINGE | Freq: Once | INTRAMUSCULAR | Status: AC
  Administered 2020-11-06: 0.5 mL via INTRAMUSCULAR
  Filled 2020-11-06: qty 0.5

## 2020-11-06 NOTE — ED Notes (Signed)
Benjamin Tran updated at this time.

## 2020-11-06 NOTE — ED Notes (Signed)
[phone]  Mother would like a update Judeth Cornfield Fryberger
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
## 2020-11-06 NOTE — ED Notes (Signed)
Pa  at bedside. 
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
## 2021-03-08 ENCOUNTER — Ambulatory Visit (INDEPENDENT_AMBULATORY_CARE_PROVIDER_SITE_OTHER): Payer: Self-pay

## 2021-03-08 ENCOUNTER — Encounter (HOSPITAL_COMMUNITY): Payer: Self-pay | Admitting: Emergency Medicine

## 2021-03-08 ENCOUNTER — Ambulatory Visit (HOSPITAL_COMMUNITY)
Admission: EM | Admit: 2021-03-08 | Discharge: 2021-03-08 | Disposition: A | Discharge status: Home / Self Care | Payer: Self-pay | Attending: Internal Medicine | Admitting: Internal Medicine

## 2021-03-08 ENCOUNTER — Other Ambulatory Visit: Payer: Self-pay

## 2021-03-08 DIAGNOSIS — M79641 Pain in right hand: Secondary | ICD-10-CM

## 2021-03-08 DIAGNOSIS — S62396A Other fracture of fifth metacarpal bone, right hand, initial encounter for closed fracture: Secondary | ICD-10-CM

## 2021-03-08 DIAGNOSIS — W228XXA Striking against or struck by other objects, initial encounter: Secondary | ICD-10-CM

## 2021-03-08 MED ORDER — IBUPROFEN 800 MG PO TABS: 800.0000 mg | ORAL_TABLET | Freq: Three times a day (TID) | ORAL | 0 refills | Status: DC | PRN
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
## 2021-03-08 NOTE — ED Notes (Signed)
Called Ortho team, they are on their way to apply splint
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
## 2021-03-08 NOTE — Discharge Instructions (Signed)
Take the ibuprofen as prescribed.  Rest and elevate your hand.  Apply ice packs 2-3 times a day for up to 20 minutes each.  Wear the splint.    Follow up with an orthopedic hand specialist such as the one listed below.

## 2021-03-15 ENCOUNTER — Ambulatory Visit (INDEPENDENT_AMBULATORY_CARE_PROVIDER_SITE_OTHER): Payer: 59 | Admitting: Orthopaedic Surgery

## 2021-03-15 ENCOUNTER — Encounter: Payer: Self-pay | Admitting: Orthopaedic Surgery

## 2021-03-15 DIAGNOSIS — M79641 Pain in right hand: Secondary | ICD-10-CM | POA: Diagnosis not present
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
## 2022-03-29 ENCOUNTER — Other Ambulatory Visit: Payer: Self-pay

## 2022-03-29 ENCOUNTER — Emergency Department (HOSPITAL_COMMUNITY): Payer: Managed Care, Other (non HMO)

## 2022-03-29 ENCOUNTER — Emergency Department (HOSPITAL_COMMUNITY)
Admission: EM | Admit: 2022-03-29 | Discharge: 2022-03-29 | Disposition: A | Discharge status: Home / Self Care | Payer: Managed Care, Other (non HMO) | Attending: Emergency Medicine | Admitting: Emergency Medicine

## 2022-03-29 ENCOUNTER — Encounter (HOSPITAL_COMMUNITY): Payer: Self-pay

## 2022-03-29 DIAGNOSIS — M25561 Pain in right knee: Secondary | ICD-10-CM | POA: Insufficient documentation

## 2022-03-29 DIAGNOSIS — S8991XA Unspecified injury of right lower leg, initial encounter: Secondary | ICD-10-CM

## 2022-03-29 DIAGNOSIS — Y9355 Activity, bike riding: Secondary | ICD-10-CM | POA: Insufficient documentation

## 2022-03-29 DIAGNOSIS — S8391XA Sprain of unspecified site of right knee, initial encounter: Secondary | ICD-10-CM | POA: Diagnosis not present

## 2022-03-29 MED ORDER — IBUPROFEN 800 MG PO TABS
800.0000 mg | ORAL_TABLET | Freq: Once | ORAL | Status: AC
  Administered 2022-03-29: 800 mg via ORAL
  Filled 2022-03-29: qty 1

## 2022-03-29 NOTE — Discharge Instructions (Addendum)
You were seen in the emergency department for your knee pain. ? ?As we discussed your imaging did not show any fractures or dislocations.  We have splinted your knee. I think you likely sprained your knee.  ? ?We normally treat this with the RICE method -- rest, ice, compression, and elevation. ? ?Please use Tylenol or ibuprofen for pain.  You may use 600 mg ibuprofen every 6 hours or 1000 mg of Tylenol every 6 hours.  You may choose to alternate between the two, this would be most effective. Do not exceed 4000 mg of Tylenol within 24 hours.  Do not exceed 3200 mg ibuprofen within 24 hours. ? ?I've attached the contact information for the orthopedic doctor, for you to make a follow-up appointment if your symptoms persist. ?
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
## 2024-03-01 IMAGING — CR DG KNEE COMPLETE 4+V*R*
4 series · 4 of 4 positions shown · non-contrast
Comparison: None.

CLINICAL DATA: Trauma, pain

EXAM:
RIGHT KNEE - COMPLETE 4+ VIEW

[t knee ap right]
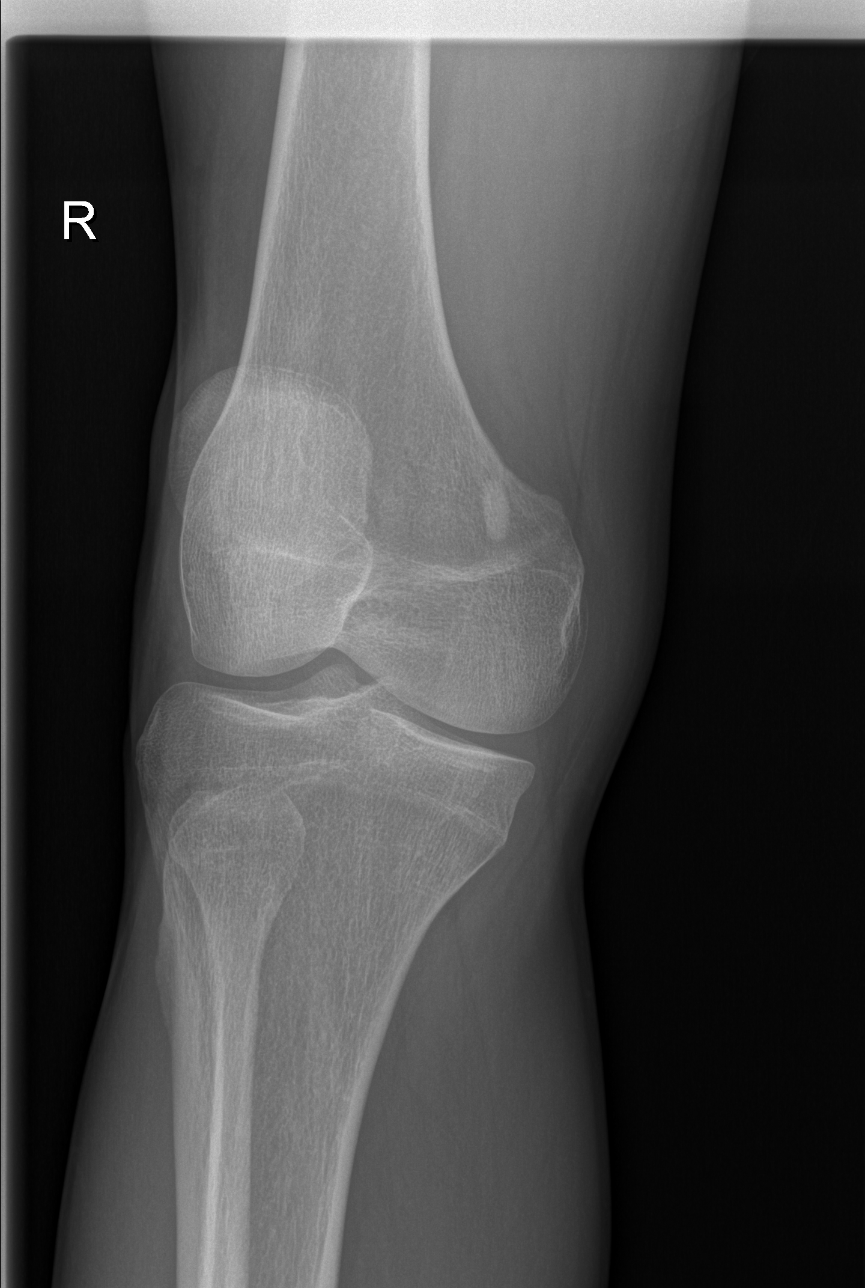

[t knee obl right (1 of 2)]
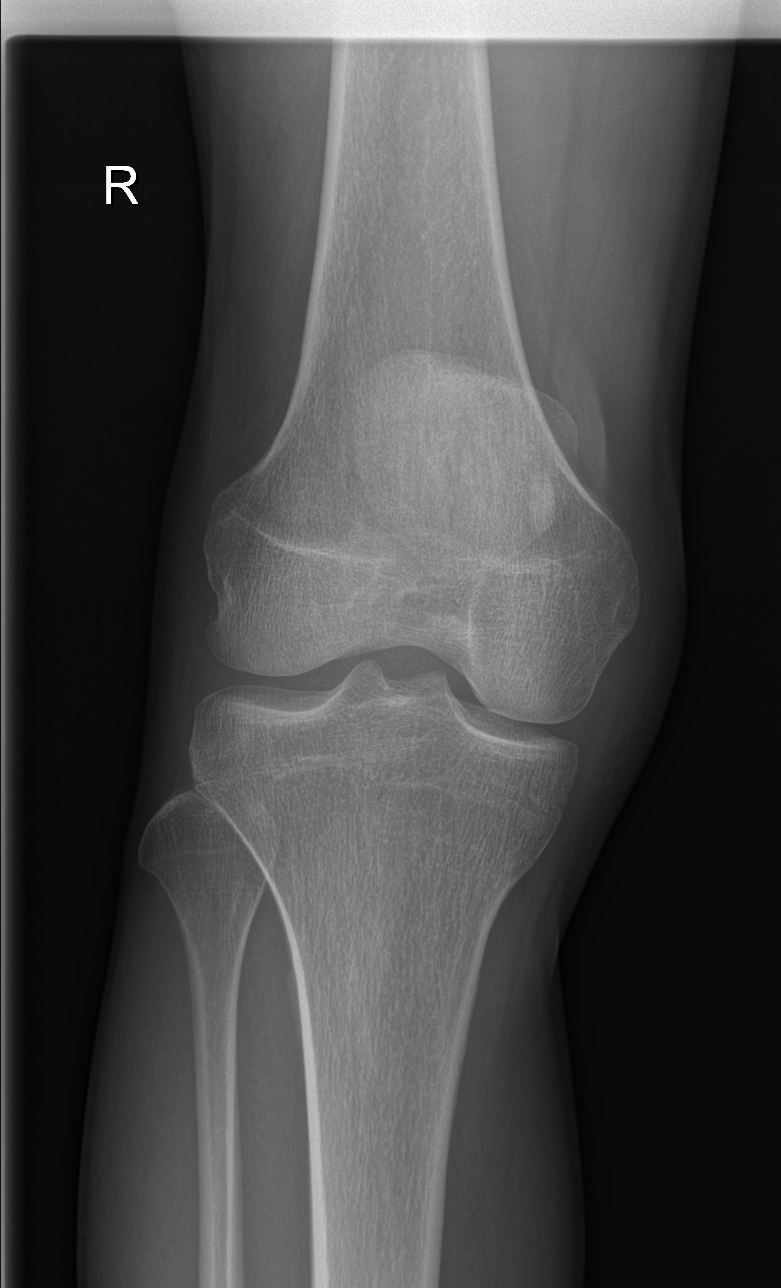

[t knee obl right (2 of 2)]
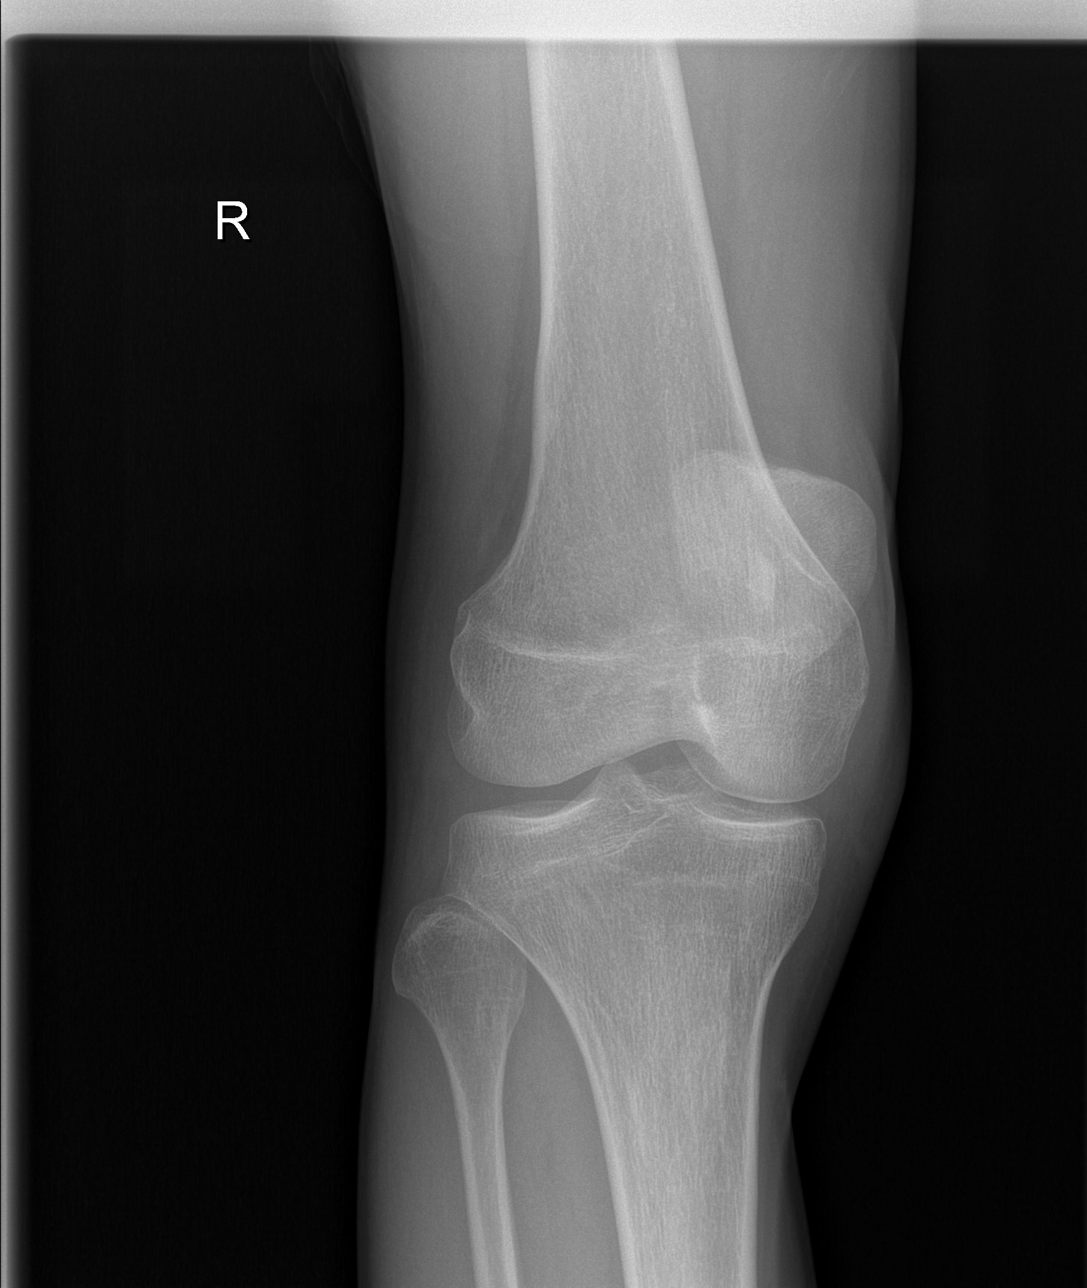

[t knee lat right]
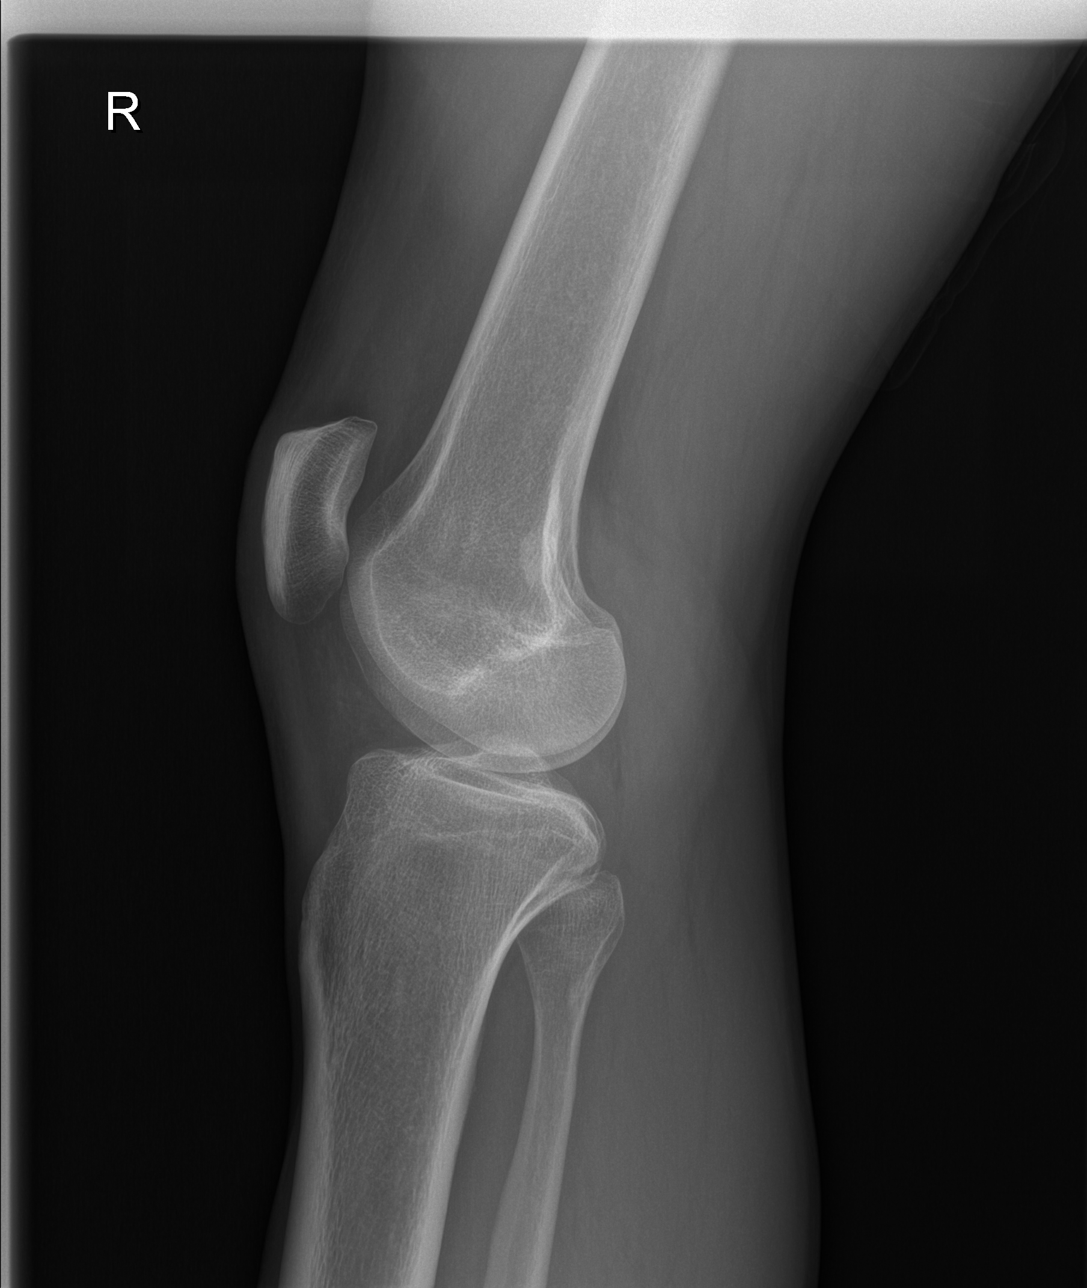

[4 of 4 positions shown; findings below may reference images not displayed]

FINDINGS: No recent fracture or dislocation is seen. There is no significant
effusion. There is small sclerotic density in the posterior medial
aspect of distal metaphysis of right femur. There is no break in the
cortical margins.
IMPRESSION: No fracture or dislocation is seen in the right knee.

Small sclerotic density in the distal right femur most likely
suggests benign bone island.

## 2024-10-10 ENCOUNTER — Emergency Department (HOSPITAL_COMMUNITY)
Admission: EM | Admit: 2024-10-10 | Discharge: 2024-10-11 | Disposition: A | Discharge status: Home / Self Care | Payer: Self-pay | Attending: Emergency Medicine | Admitting: Emergency Medicine

## 2024-10-10 ENCOUNTER — Emergency Department (HOSPITAL_COMMUNITY): Payer: Self-pay

## 2024-10-10 ENCOUNTER — Other Ambulatory Visit: Payer: Self-pay

## 2024-10-10 ENCOUNTER — Encounter (HOSPITAL_COMMUNITY): Payer: Self-pay

## 2024-10-10 DIAGNOSIS — W19XXXA Unspecified fall, initial encounter: Secondary | ICD-10-CM

## 2024-10-10 DIAGNOSIS — J45909 Unspecified asthma, uncomplicated: Secondary | ICD-10-CM | POA: Insufficient documentation

## 2024-10-10 DIAGNOSIS — G8929 Other chronic pain: Secondary | ICD-10-CM | POA: Insufficient documentation

## 2024-10-10 DIAGNOSIS — Y9351 Activity, roller skating (inline) and skateboarding: Secondary | ICD-10-CM | POA: Insufficient documentation

## 2024-10-10 DIAGNOSIS — M79651 Pain in right thigh: Secondary | ICD-10-CM | POA: Insufficient documentation

## 2024-10-10 MED ORDER — KETOROLAC TROMETHAMINE 15 MG/ML IJ SOLN
15.0000 mg | Freq: Once | INTRAMUSCULAR | Status: AC
  Administered 2024-10-11: 15 mg via INTRAMUSCULAR
  Filled 2024-10-10: qty 1

## 2024-10-10 NOTE — ED Triage Notes (Signed)
 Pt presents via POV c/o right hip and leg pain. Reports fell while skating. Denies LOC.

## 2024-10-11 NOTE — Discharge Instructions (Signed)
 You were evaluated this evening after a fall.  Your x-rays were reassuring with no fracture or dislocation noted.  I recommend over-the-counter medication such as ibuprofen  and acetaminophen  for inflammation and pain control.  You may ice the area and also use heat if you find them beneficial.  Return to the emergency department if you develop any life-threatening symptoms.
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
## 2024-11-17 ENCOUNTER — Encounter (HOSPITAL_COMMUNITY): Payer: Self-pay

## 2024-11-17 ENCOUNTER — Ambulatory Visit (HOSPITAL_COMMUNITY)
Admission: EM | Admit: 2024-11-17 | Discharge: 2024-11-17 | Disposition: A | Discharge status: Home / Self Care | Payer: Self-pay | Attending: Physician Assistant | Admitting: Physician Assistant

## 2024-11-17 DIAGNOSIS — K6289 Other specified diseases of anus and rectum: Secondary | ICD-10-CM

## 2024-11-17 DIAGNOSIS — K644 Residual hemorrhoidal skin tags: Secondary | ICD-10-CM

## 2024-11-17 MED ORDER — HYDROCORTISONE ACETATE 25 MG RE SUPP: 25.0000 mg | Freq: Two times a day (BID) | RECTAL | 0 refills | Status: AC

## 2024-11-17 NOTE — ED Triage Notes (Signed)
 Patient here today with c/o burning sensation while having a BM X 2 days. Patient states that since yesterday he has had a burning sensation in his rectum constant. Patient tried using a hemorrhoid cream with some relief. Patient states that he had blood in his stool 6 years ago.

## 2024-11-17 NOTE — Discharge Instructions (Addendum)
 VISIT SUMMARY:  You came in today because you have been experiencing a burning sensation during bowel movements, which makes it difficult for you to sit or stand comfortably. You also mentioned some abdominal discomfort and swelling in the rectal area. You have had hemorrhoids in the past, and you tried using an over-the-counter cream with minimal relief.  YOUR PLAN:  -EXTERNAL HEMORRHOIDS: External hemorrhoids are swollen veins in the rectal area that can cause burning and discomfort, especially during bowel movements. To help manage this, I recommend using Preparation H cream and a prescribed suppository to reduce the size and pain of the hemorrhoids. You can also take acetaminophen  or ibuprofen  for pain relief. Increasing your dietary fiber will help bulk up your stools and make them easier to pass. Avoid sitting on the toilet for more than ten minutes. Please return if your symptoms worsen, such as if you notice dark stools, severe pain, or pus.  INSTRUCTIONS:  Please follow up if your symptoms worsen, such as if you notice dark stools, severe pain, or pus.

## 2024-11-17 NOTE — ED Provider Notes (Signed)
 [facility]    CSN: [id number] Arrival date & time: 11/17/24  0818      History   Chief Complaint Chief Complaint  Patient presents with   Rectal Pain    HPI Benjamin Tran is a 33 y.o. male.  has a past medical history of Asthma.   HPI  Discussed the use of AI scribe software for clinical note transcription with the patient, who gave verbal consent to proceed. The patient presents with rectal burning during bowel movements.  He experiences a burning sensation during bowel movements, describing it as feeling like he 'ate some hot stuff.' This sensation makes it difficult for him to sit down or stand up comfortably. No blood or black stools are noticed.  He mentions feeling abdominal discomfort yesterday, which was somewhat alleviated by tightening his abdominal muscles. No constipation, diarrhea, nausea, vomiting, fever, or chills. However, he has noticed swelling in the rectal area.  He recalls having hemorrhoids in the past, around 2018. For the current discomfort, he used an over-the-counter cream provided by his mother, which offered minimal relief. He applied the cream as much as possible, but it was a small amount and might not have been strong enough.    Past Medical History:  Diagnosis Date   Asthma     Patient Active Problem List   Diagnosis Date Noted   Pain in right hand 03/15/2021    History reviewed. No pertinent surgical history.     Home Medications    Prior to Admission medications   Medication Sig Start Date End Date Taking? Authorizing Provider  hydrocortisone  (ANUSOL -HC) 25 MG suppository Place 1 suppository (25 mg total) rectally 2 (two) times daily. 11/17/24  Yes Angelise Petrich, Rocky BRAVO, PA-C    Family History Family History  Problem Relation Age of Onset   Healthy Mother    Healthy Father     Social History Social History   Tobacco Use   Smoking status: Never   Smokeless tobacco: Never  Vaping Use   Vaping status: Never  Used  Substance Use Topics   Alcohol use: Yes    Comment: occassionally   Drug use: Yes    Types: Marijuana     Allergies   Penicillins   Review of Systems Review of Systems  Constitutional:  Negative for chills and fever.  Gastrointestinal:  Positive for rectal pain. Negative for abdominal pain, anal bleeding, constipation, diarrhea, nausea and vomiting.  Genitourinary:  Negative for dysuria.     Physical Exam Triage Vital Signs ED Triage Vitals  Encounter Vitals Group     BP 11/17/24 0925 120/76     Girls Systolic BP Percentile --      Girls Diastolic BP Percentile --      Boys Systolic BP Percentile --      Boys Diastolic BP Percentile --      Pulse Rate 11/17/24 0925 92     Resp 11/17/24 0925 16     Temp 11/17/24 0925 98.2 F (36.8 C)     Temp Source 11/17/24 0925 Oral     SpO2 11/17/24 0925 97 %     Weight --      Height --      Head Circumference --      Peak Flow --      Pain Score 11/17/24 0922 8     Pain Loc --      Pain Education --      Exclude from Growth Chart --  No data found.  Updated Vital Signs BP 120/76 (BP Location: Right Arm)   Pulse 92   Temp 98.2 F (36.8 C) (Oral)   Resp 16   SpO2 97%   Visual Acuity Right Eye Distance:   Left Eye Distance:   Bilateral Distance:    Right Eye Near:   Left Eye Near:    Bilateral Near:     Physical Exam Vitals reviewed.  Constitutional:      General: He is awake. He is not in acute distress.    Appearance: Normal appearance. He is well-developed and well-groomed. He is not ill-appearing, toxic-appearing or diaphoretic.  HENT:     Head: Normocephalic and atraumatic.  Eyes:     Extraocular Movements: Extraocular movements intact.     Conjunctiva/sclera: Conjunctivae normal.  Pulmonary:     Effort: Pulmonary effort is normal.  Genitourinary:    Rectum: Tenderness and external hemorrhoid present.  Musculoskeletal:     Cervical back: Normal range of motion.  Neurological:     Mental  Status: He is alert and oriented to person, place, and time.  Psychiatric:        Attention and Perception: Attention normal.        Mood and Affect: Mood normal.        Speech: Speech normal.        Behavior: Behavior normal. Behavior is cooperative.        Thought Content: Thought content normal.        Judgment: Judgment normal.      UC Treatments / Results  Labs (all labs ordered are listed, but only abnormal results are displayed) Labs Reviewed - No data to display  EKG   Radiology No results found.  Procedures Procedures (including critical care time)  Medications Ordered in UC Medications - No data to display  Initial Impression / Assessment and Plan / UC Course  I have reviewed the triage vital signs and the nursing notes.  Pertinent labs & imaging results that were available during my care of the patient were reviewed by me and considered in my medical decision making (see chart for details).      Final Clinical Impressions(s) / UC Diagnoses   Final diagnoses:  Rectal pain  External hemorrhoids   External hemorrhoids Prolapsed veins causing burning sensation and discomfort during bowel movements. No blood or black stools reported. Previous hemorrhoid episode in 2018. Current symptoms include burning and discomfort, especially post-defecation. Examination reveals external hemorrhoids. - Recommended Preparation H cream for hemorrhoid treatment. - Prescribe Anusol   suppository to reduce hemorrhoid size and pain. - Advised use of acetaminophen  or ibuprofen  for pain management. - Recommended increasing dietary fiber to bulk stools and ease passage. - Advised against sitting on the toilet for more than ten- fifteen minutes. - Instructed to return if symptoms worsen, such as dark stools, severe pain, or pus.    Discharge Instructions      VISIT SUMMARY:  You came in today because you have been experiencing a burning sensation during bowel movements, which  makes it difficult for you to sit or stand comfortably. You also mentioned some abdominal discomfort and swelling in the rectal area. You have had hemorrhoids in the past, and you tried using an over-the-counter cream with minimal relief.  YOUR PLAN:  -EXTERNAL HEMORRHOIDS: External hemorrhoids are swollen veins in the rectal area that can cause burning and discomfort, especially during bowel movements. To help manage this, I recommend using Preparation H cream and a prescribed  suppository to reduce the size and pain of the hemorrhoids. You can also take acetaminophen  or ibuprofen  for pain relief. Increasing your dietary fiber will help bulk up your stools and make them easier to pass. Avoid sitting on the toilet for more than ten minutes. Please return if your symptoms worsen, such as if you notice dark stools, severe pain, or pus.  INSTRUCTIONS:  Please follow up if your symptoms worsen, such as if you notice dark stools, severe pain, or pus.     ED Prescriptions     Medication Sig Dispense Auth. Provider   hydrocortisone  (ANUSOL -HC) 25 MG suppository Place 1 suppository (25 mg total) rectally 2 (two) times daily. 12 suppository Allona Gondek E, PA-C      PDMP not reviewed this encounter.   Nakima Fluegge, Rocky BRAVO, PA-C 11/17/24 1046
